# Patient Record
Sex: Male | Born: 1995
Health system: Southern US, Community
[De-identification: ages and names within clinical notes are randomized; demographics above are authoritative.]

## PROBLEM LIST (undated history)

## (undated) DIAGNOSIS — U071 COVID-19: Secondary | ICD-10-CM

---
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
## 2016-10-09 ENCOUNTER — Ambulatory Visit (INDEPENDENT_AMBULATORY_CARE_PROVIDER_SITE_OTHER): Payer: BLUE CROSS/BLUE SHIELD | Admitting: Family Medicine

## 2016-10-09 ENCOUNTER — Encounter: Payer: Self-pay | Admitting: Family Medicine

## 2016-10-09 VITALS — BP 122/72 | HR 85 | Temp 97.8°F | Resp 17 | Ht 72.5 in | Wt 160.0 lb

## 2016-10-09 DIAGNOSIS — J0100 Acute maxillary sinusitis, unspecified: Secondary | ICD-10-CM

## 2016-10-09 MED ORDER — AMOXICILLIN-POT CLAVULANATE 875-125 MG PO TABS: 1.0000 | ORAL_TABLET | Freq: Two times a day (BID) | ORAL | 0 refills | Status: DC

## 2016-10-09 MED ORDER — FLUTICASONE PROPIONATE 50 MCG/ACT NA SUSP: 2.0000 | Freq: Every day | NASAL | 6 refills | Status: DC

## 2016-10-09 NOTE — Progress Notes (Signed)
  Chief Complaint  Patient presents with  . Sinusitis  . URI  . Cough    HPI  Pt reports that his symptoms include Sore throat Sinus pressure  Productive cough with green phlegm Body aches Fatigue His little brother, 16, and sister, 24, have symptoms as well  He denies a history of asthma  No past medical history on file.  Current Outpatient Prescriptions  Medication Sig Dispense Refill  . amoxicillin-clavulanate (AUGMENTIN) 875-125 MG tablet Take 1 tablet by mouth 2 (two) times daily. 20 tablet 0  . fluticasone (FLONASE) 50 MCG/ACT nasal spray Place 2 sprays into both nostrils daily. 16 g 6   No current facility-administered medications for this visit.     Allergies: No Known Allergies  No past surgical history on file.  Social History   Social History  . Marital status: Single    Spouse name: n/a  . Number of children: N/A  . Years of education: N/A   Occupational History  . ice cream scooper    Social History Main Topics  . Smoking status: Never Smoker  . Smokeless tobacco: Never Used  . Alcohol use No  . Drug use: Yes    Types: Marijuana  . Sexual activity: Yes   Other Topics Concern  . None   Social History Narrative  . None    ROS  Objective: Vitals:   10/09/16 1347  BP: 122/72  Pulse: 85  Resp: 17  Temp: 97.8 F (36.6 C)  TempSrc: Oral  SpO2: 97%  Weight: 160 lb (72.6 kg)  Height: 6' 0.5" (1.842 m)    Physical Exam General: alert, oriented, in NAD Head: normocephalic, atraumatic, + maxillary sinus tenderness Eyes: EOM intact, no scleral icterus or conjunctival injection Ears: TM clear bilaterally Nose: edema of the nares bilaterally Throat: no pharyngeal exudate or erythema Lymph: no posterior auricular, submental or cervical lymph adenopathy Heart: normal rate, normal sinus rhythm, no murmurs Lungs: clear to auscultation bilaterally, no wheezing   Assessment and Plan Jack Yates was seen today for sinusitis, uri and  cough.  Diagnoses and all orders for this visit:  Acute non-recurrent maxillary sinusitis  Advised OTC cough meds Discussed medicines Work note -     fluticasone (FLONASE) 50 MCG/ACT nasal spray; Place 2 sprays into both nostrils daily. -     amoxicillin-clavulanate (AUGMENTIN) 875-125 MG tablet; Take 1 tablet by mouth 2 (two) times daily.     Zoe A Stallings

## 2016-10-09 NOTE — Patient Instructions (Addendum)
   IF you received an x-ray today, you will receive an invoice from [facility]. Please contact [facility] at [phone] with questions or concerns regarding your invoice.   IF you received labwork today, you will receive an invoice from [facility]. Please contact [facility] at [phone] with questions or concerns regarding your invoice.   Our billing staff will not be able to assist you with questions regarding bills from these companies.  You will be contacted with the lab results as soon as they are available. The fastest way to get your results is to activate your My Chart account. Instructions are located on the last page of this paperwork. If you have not heard from us regarding the results in 2 weeks, please contact this office.      Sinusitis, Adult Sinusitis is soreness and inflammation of your sinuses. Sinuses are hollow spaces in the bones around your face. Your sinuses are located:  Around your eyes.  In the middle of your forehead.  Behind your nose.  In your cheekbones. Your sinuses and nasal passages are lined with a stringy fluid (mucus). Mucus normally drains out of your sinuses. When your nasal tissues become inflamed or swollen, the mucus can become trapped or blocked so air cannot flow through your sinuses. This allows bacteria, viruses, and funguses to grow, which leads to infection. Sinusitis can develop quickly and last for 7?10 days (acute) or for more than 12 weeks (chronic). Sinusitis often develops after a cold. What are the causes? This condition is caused by anything that creates swelling in the sinuses or stops mucus from draining, including:  Allergies.  Asthma.  Bacterial or viral infection.  Abnormally shaped bones between the nasal passages.  Nasal growths that contain mucus (nasal polyps).  Narrow sinus openings.  Pollutants, such as chemicals or irritants in the air.  A foreign object stuck in the nose.  A fungal  infection. This is rare. What increases the risk? The following factors may make you more likely to develop this condition:  Having allergies or asthma.  Having had a recent cold or respiratory tract infection.  Having structural deformities or blockages in your nose or sinuses.  Having a weak immune system.  Doing a lot of swimming or diving.  Overusing nasal sprays.  Smoking. What are the signs or symptoms? The main symptoms of this condition are pain and a feeling of pressure around the affected sinuses. Other symptoms include:  Upper toothache.  Earache.  Headache.  Bad breath.  Decreased sense of smell and taste.  A cough that may get worse at night.  Fatigue.  Fever.  Thick drainage from your nose. The drainage is often green and it may contain pus (purulent).  Stuffy nose or congestion.  Postnasal drip. This is when extra mucus collects in the throat or back of the nose.  Swelling and warmth over the affected sinuses.  Sore throat.  Sensitivity to light. How is this diagnosed? This condition is diagnosed based on symptoms, a medical history, and a physical exam. To find out if your condition is acute or chronic, your health care provider may:  Look in your nose for signs of nasal polyps.  Tap over the affected sinus to check for signs of infection.  View the inside of your sinuses using an imaging device that has a light attached (endoscope). If your health care provider suspects that you have chronic sinusitis, you may also:  Be tested for allergies.  Have a sample of   mucus taken from your nose (nasal culture) and checked for bacteria.  Have a mucus sample examined to see if your sinusitis is related to an allergy. If your sinusitis does not respond to treatment and it lasts longer than 8 weeks, you may have an MRI or CT scan to check your sinuses. These scans also help to determine how severe your infection is. In rare cases, a bone biopsy may  be done to rule out more serious types of fungal sinus disease. How is this treated? Treatment for sinusitis depends on the cause and whether your condition is chronic or acute. If a virus is causing your sinusitis, your symptoms will go away on their own within 10 days. You may be given medicines to relieve your symptoms, including:  Topical nasal decongestants. They shrink swollen nasal passages and let mucus drain from your sinuses.  Antihistamines. These drugs block inflammation that is triggered by allergies. This can help to ease swelling in your nose and sinuses.  Topical nasal corticosteroids. These are nasal sprays that ease inflammation and swelling in your nose and sinuses.  Nasal saline washes. These rinses can help to get rid of thick mucus in your nose. If your condition is caused by bacteria, you will be given an antibiotic medicine. If your condition is caused by a fungus, you will be given an antifungal medicine. Surgery may be needed to correct underlying conditions, such as narrow nasal passages. Surgery may also be needed to remove polyps. Follow these instructions at home: Medicines   Take, use, or apply over-the-counter and prescription medicines only as told by your health care provider. These may include nasal sprays.  If you were prescribed an antibiotic medicine, take it as told by your health care provider. Do not stop taking the antibiotic even if you start to feel better. Hydrate and Humidify   Drink enough water to keep your urine clear or pale yellow. Staying hydrated will help to thin your mucus.  Use a cool mist humidifier to keep the humidity level in your home above 50%.  Inhale steam for 10-15 minutes, 3-4 times a day or as told by your health care provider. You can do this in the bathroom while a hot shower is running.  Limit your exposure to cool or dry air. Rest   Rest as much as possible.  Sleep with your head raised (elevated).  Make sure to  get enough sleep each night. General instructions   Apply a warm, moist washcloth to your face 3-4 times a day or as told by your health care provider. This will help with discomfort.  Wash your hands often with soap and water to reduce your exposure to viruses and other germs. If soap and water are not available, use hand sanitizer.  Do not smoke. Avoid being around people who are smoking (secondhand smoke).  Keep all follow-up visits as told by your health care provider. This is important. Contact a health care provider if:  You have a fever.  Your symptoms get worse.  Your symptoms do not improve within 10 days. Get help right away if:  You have a severe headache.  You have persistent vomiting.  You have pain or swelling around your face or eyes.  You have vision problems.  You develop confusion.  Your neck is stiff.  You have trouble breathing. This information is not intended to replace advice given to you by your health care provider. Make sure you discuss any questions you have with   your health care provider. Document Released: 07/20/2005 Document Revised: 03/15/2016 Document Reviewed: 05/15/2015 Elsevier Interactive Patient Education  2017 Elsevier Inc.  

## 2017-09-18 ENCOUNTER — Ambulatory Visit (HOSPITAL_COMMUNITY)
Admission: EM | Admit: 2017-09-18 | Discharge: 2017-09-18 | Disposition: A | Discharge status: Home / Self Care | Payer: BLUE CROSS/BLUE SHIELD | Attending: Family Medicine | Admitting: Family Medicine

## 2017-09-18 ENCOUNTER — Encounter (HOSPITAL_COMMUNITY): Payer: Self-pay | Admitting: Family Medicine

## 2017-09-18 DIAGNOSIS — J40 Bronchitis, not specified as acute or chronic: Secondary | ICD-10-CM

## 2017-09-18 DIAGNOSIS — R6889 Other general symptoms and signs: Secondary | ICD-10-CM

## 2017-09-18 MED ORDER — ACETAMINOPHEN 325 MG PO TABS
ORAL_TABLET | ORAL | Status: AC
  Filled 2017-09-18: qty 2

## 2017-09-18 MED ORDER — AZITHROMYCIN 250 MG PO TABS: 250.0000 mg | ORAL_TABLET | Freq: Every day | ORAL | 0 refills | Status: DC

## 2017-09-18 MED ORDER — ACETAMINOPHEN 325 MG PO TABS
650.0000 mg | ORAL_TABLET | Freq: Once | ORAL | Status: AC
  Administered 2017-09-18: 650 mg via ORAL

## 2017-09-18 MED ORDER — OSELTAMIVIR PHOSPHATE 75 MG PO CAPS: 75.0000 mg | ORAL_CAPSULE | Freq: Two times a day (BID) | ORAL | 0 refills | Status: DC

## 2017-09-18 NOTE — ED Provider Notes (Addendum)
[facility]   [id number] 09/18/17 Arrival Time: 1244  ASSESSMENT & PLAN:  1. Flu-like symptoms   2. Bronchitis     Meds ordered this encounter  Medications  . acetaminophen (TYLENOL) tablet 650 mg  . azithromycin (ZITHROMAX) 250 MG tablet    Sig: Take 1 tablet (250 mg total) by mouth daily. Take first 2 tablets together, then 1 every day until finished.    Dispense:  6 tablet    Refill:  0    Order Specific Question:   Supervising Provider    Answer:   Mardella Layman [id number]  . oseltamivir (TAMIFLU) 75 MG capsule    Sig: Take 1 capsule (75 mg total) by mouth every 12 (twelve) hours.    Dispense:  10 capsule    Refill:  0    Order Specific Question:   Supervising Provider    Answer:   Mardella Layman [[id number]]    Reviewed expectations re: course of current medical issues. Questions answered. Outlined signs and symptoms indicating need for more acute intervention. Patient verbalized understanding. After Visit Summary given.   SUBJECTIVE: History from: patient. Jack Yates is a 22 y.o. male who presents with complaint of persistent cough and flu like sx's. Reports abrupt onset today. Described symptoms have gradually worsened since beginning.  ROS: As per HPI.   OBJECTIVE:  Vitals:   09/18/17 1337  BP: 120/69  Pulse: 98  Resp: (!) 22  Temp: 100.2 F (37.9 C)  SpO2: 98%    General appearance: alert; no distress Eyes: PERRLA; EOMI; conjunctiva normal HENT: normocephalic; atraumatic; TMs normal; nasal mucosa normal; oral mucosa normal Neck: supple  Lungs: clear to auscultation bilaterally Heart: regular rate and rhythm Abdomen: soft, non-tender; bowel sounds normal; no masses or organomegaly; no guarding or rebound tenderness Back: no CVA tenderness Extremities: no cyanosis or edema; symmetrical with no gross deformities Skin: warm and dry Neurologic: normal gait; normal symmetric reflexes Psychological: alert and cooperative; normal mood and  affect  Labs: Results for orders placed or performed during the hospital encounter of 04/03/14  Salicylate level  Result Value Ref Range   Salicylate Lvl <2.0 (L) 2.8 - 20.0 mg/dL  Acetaminophen level  Result Value Ref Range   Acetaminophen (Tylenol), Serum <15.0 10 - 30 ug/mL  Ethanol  Result Value Ref Range   Alcohol, Ethyl (B) <11 0 - 11 mg/dL  Drug screen panel, emergency  Result Value Ref Range   Opiates NONE DETECTED NONE DETECTED   Cocaine NONE DETECTED NONE DETECTED   Benzodiazepines NONE DETECTED NONE DETECTED   Amphetamines NONE DETECTED NONE DETECTED   Tetrahydrocannabinol POSITIVE (A) NONE DETECTED   Barbiturates NONE DETECTED NONE DETECTED  Urinalysis, Routine w reflex microscopic  Result Value Ref Range   Color, Urine AMBER (A) YELLOW   APPearance CLOUDY (A) CLEAR   Specific Gravity, Urine 1.027 1.005 - 1.030   pH 7.5 5.0 - 8.0   Glucose, UA NEGATIVE NEGATIVE mg/dL   Hgb urine dipstick NEGATIVE NEGATIVE   Bilirubin Urine NEGATIVE NEGATIVE   Ketones, ur 15 (A) NEGATIVE mg/dL   Protein, ur 30 (A) NEGATIVE mg/dL   Urobilinogen, UA 1.0 0.0 - 1.0 mg/dL   Nitrite NEGATIVE NEGATIVE   Leukocytes, UA NEGATIVE NEGATIVE  Comprehensive metabolic panel  Result Value Ref Range   Sodium 139 137 - 147 mEq/L   Potassium 3.4 (L) 3.7 - 5.3 mEq/L   Chloride 102 96 - 112 mEq/L   CO2 22 19 - 32 mEq/L  Glucose, Bld 109 (H) 70 - 99 mg/dL   BUN 13 6 - 23 mg/dL   Creatinine, Ser 1.610.75 0.47 - 1.00 mg/dL   Calcium 9.2 8.4 - 09.610.5 mg/dL   Total Protein 7.2 6.0 - 8.3 g/dL   Albumin 4.5 3.5 - 5.2 g/dL   AST 18 0 - 37 U/L   ALT 13 0 - 53 U/L   Alkaline Phosphatase 87 52 - 171 U/L   Total Bilirubin 1.0 0.3 - 1.2 mg/dL   GFR calc non Af Amer NOT CALCULATED >90 mL/min   GFR calc Af Amer NOT CALCULATED >90 mL/min   Anion gap 15 5 - 15  CBC  Result Value Ref Range   WBC 6.4 4.5 - 13.5 K/uL   RBC 4.61 3.80 - 5.70 MIL/uL   Hemoglobin 14.0 12.0 - 16.0 g/dL   HCT 04.540.1 40.936.0 - 81.149.0 %    MCV 87.0 78.0 - 98.0 fL   MCH 30.4 25.0 - 34.0 pg   MCHC 34.9 31.0 - 37.0 g/dL   RDW 91.412.3 78.211.4 - 95.615.5 %   Platelets 205 150 - 400 K/uL  Urine microscopic-add on  Result Value Ref Range   Squamous Epithelial / LPF RARE RARE   WBC, UA 0-2 <3 WBC/hpf   RBC / HPF 0-2 <3 RBC/hpf   Bacteria, UA FEW (A) RARE   Urine-Other MUCOUS PRESENT    Labs Reviewed - No data to display  Imaging: No results found.  No Known Allergies  History reviewed. No pertinent past medical history. Social History   Socioeconomic History  . Marital status: Single    Spouse name: n/a  . Number of children: Not on file  . Years of education: Not on file  . Highest education level: Not on file  Social Needs  . Financial resource strain: Not on file  . Food insecurity - worry: Not on file  . Food insecurity - inability: Not on file  . Transportation needs - medical: Not on file  . Transportation needs - non-medical: Not on file  Occupational History  . Occupation: ice cream scooper  Tobacco Use  . Smoking status: Never Smoker  . Smokeless tobacco: Never Used  Substance and Sexual Activity  . Alcohol use: No  . Drug use: Yes    Types: Marijuana  . Sexual activity: Yes  Other Topics Concern  . Not on file  Social History Narrative  . Not on file   History reviewed. No pertinent family history. History reviewed. No pertinent surgical history.   Deatra CanterOxford, William J, FNP 09/18/17 2058    Deatra Canterxford, William J, FNP 09/18/17 2115

## 2017-09-18 NOTE — ED Triage Notes (Signed)
Pt here for cough, congestion, SOB and body aches x a few week.

## 2018-04-03 ENCOUNTER — Encounter (HOSPITAL_COMMUNITY): Payer: Self-pay

## 2018-04-03 ENCOUNTER — Other Ambulatory Visit: Payer: Self-pay

## 2018-04-03 ENCOUNTER — Ambulatory Visit (HOSPITAL_COMMUNITY)
Admission: EM | Admit: 2018-04-03 | Discharge: 2018-04-03 | Disposition: A | Discharge status: Home / Self Care | Payer: BLUE CROSS/BLUE SHIELD | Attending: Family Medicine | Admitting: Family Medicine

## 2018-04-03 DIAGNOSIS — H9203 Otalgia, bilateral: Secondary | ICD-10-CM | POA: Insufficient documentation

## 2018-04-03 DIAGNOSIS — J0140 Acute pansinusitis, unspecified: Secondary | ICD-10-CM | POA: Diagnosis not present

## 2018-04-03 DIAGNOSIS — F411 Generalized anxiety disorder: Secondary | ICD-10-CM | POA: Diagnosis not present

## 2018-04-03 DIAGNOSIS — R0981 Nasal congestion: Secondary | ICD-10-CM | POA: Diagnosis not present

## 2018-04-03 DIAGNOSIS — J029 Acute pharyngitis, unspecified: Secondary | ICD-10-CM

## 2018-04-03 DIAGNOSIS — R05 Cough: Secondary | ICD-10-CM

## 2018-04-03 DIAGNOSIS — F1219 Cannabis abuse with unspecified cannabis-induced disorder: Secondary | ICD-10-CM | POA: Diagnosis not present

## 2018-04-03 DIAGNOSIS — F329 Major depressive disorder, single episode, unspecified: Secondary | ICD-10-CM | POA: Diagnosis not present

## 2018-04-03 LAB — POCT RAPID STREP A: Streptococcus, Group A Screen (Direct): NEGATIVE

## 2018-04-03 MED ORDER — AMOXICILLIN-POT CLAVULANATE 875-125 MG PO TABS: 1.0000 | ORAL_TABLET | Freq: Two times a day (BID) | ORAL | 0 refills | Status: DC

## 2018-04-03 MED ORDER — IPRATROPIUM BROMIDE 0.06 % NA SOLN: 2.0000 | Freq: Four times a day (QID) | NASAL | 0 refills | Status: DC

## 2018-04-03 MED ORDER — BENZONATATE 100 MG PO CAPS: 100.0000 mg | ORAL_CAPSULE | Freq: Three times a day (TID) | ORAL | 0 refills | Status: DC

## 2018-04-03 MED ORDER — FLUTICASONE PROPIONATE 50 MCG/ACT NA SUSP: 2.0000 | Freq: Every day | NASAL | 0 refills | Status: DC

## 2018-04-03 NOTE — ED Provider Notes (Addendum)
[facility]    CSN: [id number] Arrival date & time: 04/03/18  1009     History   Chief Complaint Chief Complaint  Patient presents with  . Sore Throat  . Nasal Congestion    HPI [name] is a 22 y.o. male.   22 year old male comes in for 3-week history of URI symptoms.  Has been having productive cough, nasal congestion, rhinorrhea, postnasal drip.  Bilateral ear pain when coughing, yawning, swallowing.  Denies fever, chills, night sweats.  Sore throat, with painful swallowing, but denies shortness of breath, trouble breathing, swelling of the throat, tripoding, drooling.  Has not taken anything for the symptoms.  Positive sick contact.  Never smoker.     History reviewed. No pertinent past medical history.  Patient Active Problem List   Diagnosis Date Noted  . Wrist fracture 12/14/2015  . MDD (major depressive disorder), single episode, moderate (HCC) 04/04/2014  . Generalized anxiety disorder 04/04/2014  . Cannabis use disorder, mild, abuse 04/04/2014    History reviewed. No pertinent surgical history.     Home Medications    Prior to Admission medications   Medication Sig Start Date End Date Taking? Authorizing Provider  amoxicillin-clavulanate (AUGMENTIN) 875-125 MG tablet Take 1 tablet by mouth every 12 (twelve) hours. 04/03/18   Cathie Hoops, Reymond Maynez V, PA-C  benzonatate (TESSALON) 100 MG capsule Take 1 capsule (100 mg total) by mouth every 8 (eight) hours. 04/03/18   Cathie Hoops, Dev Dhondt V, PA-C  fluticasone (FLONASE) 50 MCG/ACT nasal spray Place 2 sprays into both nostrils daily. 04/03/18   Cathie Hoops, Emmett Arntz V, PA-C  ipratropium (ATROVENT) 0.06 % nasal spray Place 2 sprays into both nostrils 4 (four) times daily. 04/03/18   Belinda Fisher, PA-C    Family History History reviewed. No pertinent family history.  Social History Social History   Tobacco Use  . Smoking status: Never Smoker  . Smokeless tobacco: Never Used  Substance Use Topics  . Alcohol use: No  . Drug use: Yes   Types: Marijuana     Allergies   Patient has no known allergies.   Review of Systems Review of Systems  Reason unable to perform ROS: See HPI as above.     Physical Exam Triage Vital Signs ED Triage Vitals  Enc Vitals Group     BP 04/03/18 1031 123/72     Pulse Rate 04/03/18 1031 66     Resp 04/03/18 1031 16     Temp 04/03/18 1031 98 F (36.7 C)     Temp Source 04/03/18 1031 Oral     SpO2 04/03/18 1031 99 %     Weight --      Height --      Head Circumference --      Peak Flow --      Pain Score 04/03/18 1033 8     Pain Loc --      Pain Edu? --      Excl. in GC? --    No data found.  Updated Vital Signs BP 123/72 (BP Location: Left Arm)   Pulse 66   Temp 98 F (36.7 C) (Oral)   Resp 16   SpO2 99%   Physical Exam  Constitutional: He is oriented to person, place, and time. He appears well-developed and well-nourished.  Non-toxic appearance. He does not appear ill. No distress.  HENT:  Head: Normocephalic and atraumatic.  Right Ear: External ear and ear canal normal. Tympanic membrane is erythematous. Tympanic membrane is not bulging.  Left Ear: External ear and ear canal normal. Tympanic membrane is erythematous. Tympanic membrane is not bulging.  Nose: Mucosal edema and rhinorrhea present. Right sinus exhibits maxillary sinus tenderness. Right sinus exhibits no frontal sinus tenderness. Left sinus exhibits maxillary sinus tenderness. Left sinus exhibits no frontal sinus tenderness.  Mouth/Throat: Uvula is midline and mucous membranes are normal. Posterior oropharyngeal erythema present. Tonsils are 2+ on the right. Tonsils are 2+ on the left. No tonsillar exudate.  Eyes: Pupils are equal, round, and reactive to light. Conjunctivae are normal.  Neck: Normal range of motion. Neck supple.  Cardiovascular: Normal rate, regular rhythm and normal heart sounds. Exam reveals no gallop and no friction rub.  No murmur heard. Pulmonary/Chest: Effort normal and breath  sounds normal. He has no decreased breath sounds. He has no wheezes. He has no rhonchi. He has no rales.  Lymphadenopathy:    He has no cervical adenopathy.  Neurological: He is alert and oriented to person, place, and time.  Skin: Skin is warm and dry.  Psychiatric: He has a normal mood and affect. His behavior is normal. Judgment normal.     UC Treatments / Results  Labs (all labs ordered are listed, but only abnormal results are displayed) Labs Reviewed  CULTURE, GROUP A STREP [facility])  POCT RAPID STREP A    EKG None  Radiology No results found.  Procedures Procedures (including critical care time)  Medications Ordered in UC Medications - No data to display  Initial Impression / Assessment and Plan / UC Course  I have reviewed the triage vital signs and the nursing notes.  Pertinent labs & imaging results that were available during my care of the patient were reviewed by me and considered in my medical decision making (see chart for details).    Augmentin for sinusitis.  Other symptomatic treatment discussed.  Return precautions given.  Patient expresses understanding and agrees to plan.  Final Clinical Impressions(s) / UC Diagnoses   Final diagnoses:  Acute non-recurrent pansinusitis    ED Prescriptions    Medication Sig Dispense Auth. Provider   amoxicillin-clavulanate (AUGMENTIN) 875-125 MG tablet Take 1 tablet by mouth every 12 (twelve) hours. 14 tablet Cristi Gwynn V, PA-C   fluticasone (FLONASE) 50 MCG/ACT nasal spray Place 2 sprays into both nostrils daily. 1 g Dorlis Judice V, PA-C   ipratropium (ATROVENT) 0.06 % nasal spray Place 2 sprays into both nostrils 4 (four) times daily. 15 mL Zyaira Vejar V, PA-C   benzonatate (TESSALON) 100 MG capsule Take 1 capsule (100 mg total) by mouth every 8 (eight) hours. 21 capsule Threasa Alpha, PA-C 04/03/18 1055    [address] PA-C 04/03/18 1055

## 2018-04-03 NOTE — ED Notes (Signed)
Pt discharged by provider.

## 2018-04-03 NOTE — ED Triage Notes (Signed)
Pt presents to [facility] for sore throat and nasal congestion x1 month, pt complains of cough, and bilateral ear pain also, pt has been exposed to a sick family member with same symptoms.

## 2018-04-03 NOTE — Discharge Instructions (Signed)
Start augmentin as directed for sinus infection. Tessalon for cough. Start flonase, atrovent nasal spray for nasal congestion/drainage. You can use over the counter nasal saline rinse such as neti pot for nasal congestion. Keep hydrated, your urine should be clear to pale yellow in color. Tylenol/motrin for fever and pain. Monitor for any worsening of symptoms, chest pain, shortness of breath, wheezing, swelling of the throat, follow up for reevaluation.   For sore throat/cough try using a honey-based tea. Use 3 teaspoons of honey with juice squeezed from half lemon. Place shaved pieces of ginger into 1/2-1 cup of water and warm over stove top. Then mix the ingredients and repeat every 4 hours as needed.

## 2018-04-05 LAB — CULTURE, GROUP A STREP (THRC)

## 2018-04-10 IMAGING — CR DG WRIST COMPLETE 3+V*R*
2 series · 2 of 2 positions shown · non-contrast
Comparison: 11/22/2015 and 12/06/2015

CLINICAL DATA: Fall 3 weeks ago, re-evaluate fracture.

EXAM:
RIGHT WRIST - COMPLETE 3+ VIEW

[PA]
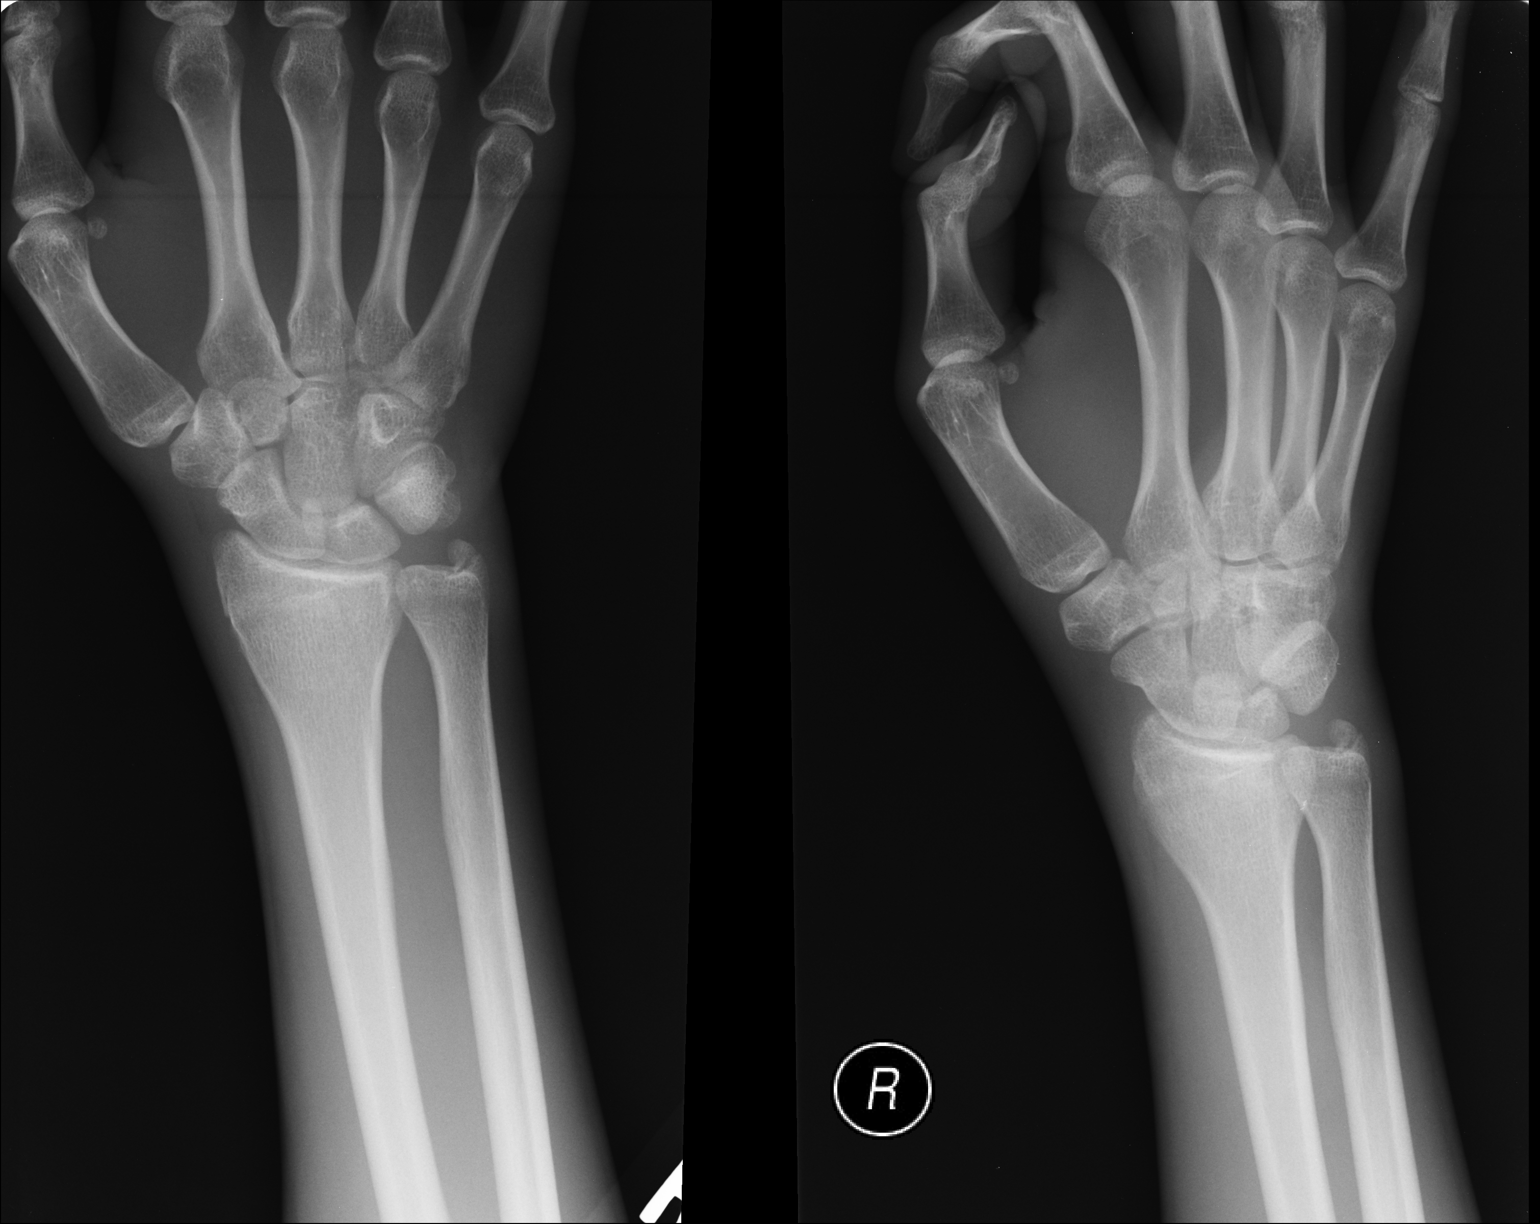

[lateral]
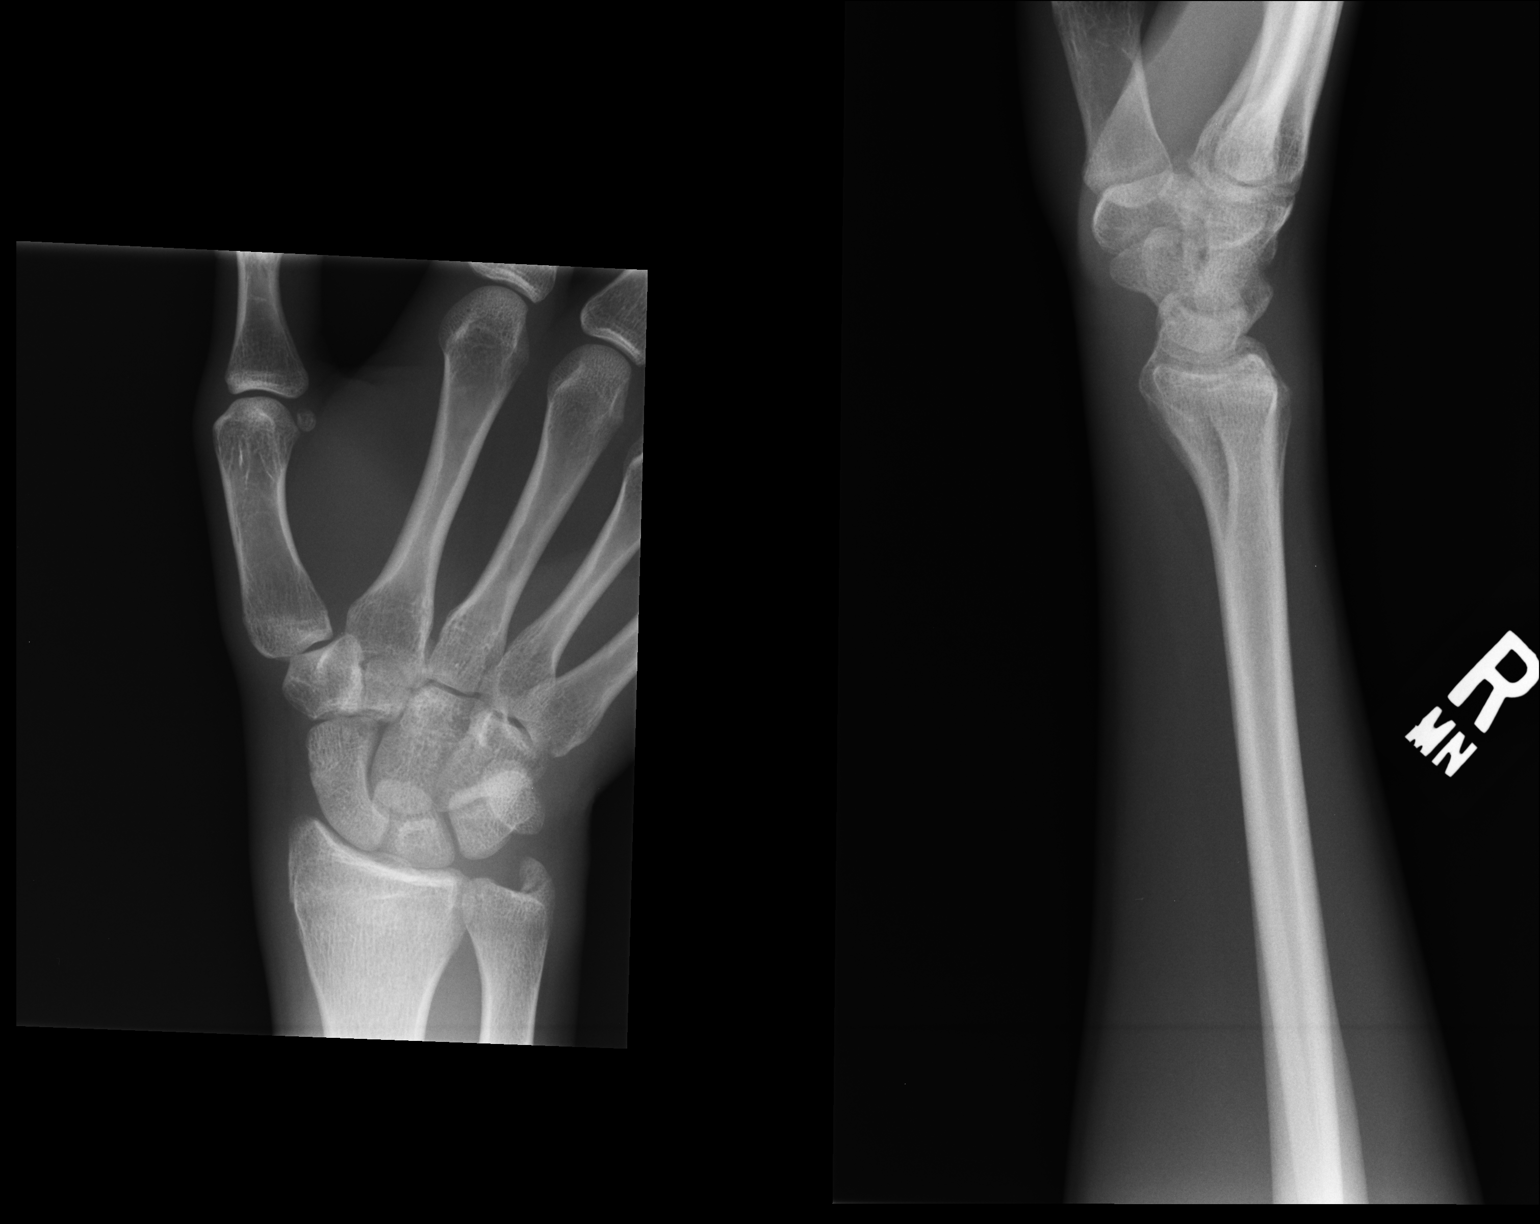

[2 of 2 positions shown; findings below may reference images not displayed]

FINDINGS: No significant change in patient's minimally displaced ulnar styloid
fracture. Lucency involving patient's radial styloid fracture less
well-defined and more difficult to visualize. Remainder of the exam
is unchanged.
IMPRESSION: Healing radial styloid fracture and no change in minimally displaced
ulnar styloid fracture.

## 2018-08-25 ENCOUNTER — Ambulatory Visit: Payer: BLUE CROSS/BLUE SHIELD

## 2018-08-25 ENCOUNTER — Ambulatory Visit (INDEPENDENT_AMBULATORY_CARE_PROVIDER_SITE_OTHER): Payer: BLUE CROSS/BLUE SHIELD

## 2018-08-25 ENCOUNTER — Encounter: Payer: Self-pay | Admitting: Podiatry

## 2018-08-25 ENCOUNTER — Ambulatory Visit: Payer: BLUE CROSS/BLUE SHIELD | Admitting: Podiatry

## 2018-08-25 VITALS — BP 116/68 | HR 65

## 2018-08-25 DIAGNOSIS — M2142 Flat foot [pes planus] (acquired), left foot: Secondary | ICD-10-CM

## 2018-08-25 DIAGNOSIS — M7751 Other enthesopathy of right foot: Secondary | ICD-10-CM | POA: Diagnosis not present

## 2018-08-25 DIAGNOSIS — M2141 Flat foot [pes planus] (acquired), right foot: Secondary | ICD-10-CM

## 2018-08-25 DIAGNOSIS — M79672 Pain in left foot: Secondary | ICD-10-CM

## 2018-08-25 DIAGNOSIS — S93401S Sprain of unspecified ligament of right ankle, sequela: Secondary | ICD-10-CM

## 2018-08-25 MED ORDER — MELOXICAM 15 MG PO TABS: 15.0000 mg | ORAL_TABLET | Freq: Every day | ORAL | 0 refills | Status: DC

## 2018-08-25 NOTE — Patient Instructions (Addendum)
Ankle Sprain, Phase I Rehab Ask your health care provider which exercises are safe for you. Do exercises exactly as told by your health care provider and adjust them as directed. It is normal to feel mild stretching, pulling, tightness, or discomfort as you do these exercises, but you should stop right away if you feel sudden pain or your pain gets worse.Do not begin these exercises until told by your health care provider. Stretching and range of motion exercises These exercises warm up your muscles and joints and improve the movement and flexibility of your lower leg and ankle. These exercises also help to relieve pain and stiffness. Exercise A: Gastroc and soleus stretch  1. Sit on the floor with your left / right leg extended. 2. Loop a belt or towel around the ball of your left / right foot. The ball of your foot is on the walking surface, right under your toes. 3. Keep your left / right ankle and foot relaxed and keep your knee straight while you use the belt or towel to pull your foot toward you. You should feel a gentle stretch behind your calf or knee. 4. Hold this position for __________ seconds, then release to the starting position. Repeat the exercise with your knee bent. You can put a pillow or a rolled bath towel under your knee to support it. You should feel a stretch deep in your calf or at your Achilles tendon. Repeat each stretch __________ times. Complete these stretches __________ times a day. Exercise B: Ankle alphabet  1. Sit with your left / right leg supported at the lower leg. ? Do not rest your foot on anything. ? Make sure your foot has room to move freely. 2. Think of your left / right foot as a paintbrush, and move your foot to trace each letter of the alphabet in the air. Keep your hip and knee still while you trace. Make the letters as large as you can without feeling discomfort. 3. Trace every letter from A to Z. Repeat __________ times. Complete this exercise  __________ times a day. Strengthening exercises These exercises build strength and endurance in your ankle and lower leg. Endurance is the ability to use your muscles for a long time, even after they get tired. Exercise C: Dorsiflexors  1. Secure a rubber exercise band or tube to an object, such as a table leg, that will stay still when the band is pulled. Secure the other end around your left / right foot. 2. Sit on the floor facing the object, with your left / right leg extended. The band or tube should be slightly tense when your foot is relaxed. 3. Slowly bring your foot toward you, pulling the band tighter. 4. Hold this position for __________ seconds. 5. Slowly return your foot to the starting position. Repeat __________ times. Complete this exercise __________ times a day. Exercise D: Plantar flexors  1. Sit on the floor with your left / right leg extended. 2. Loop a rubber exercise tube or band around the ball of your left / right foot. The ball of your foot is on the walking surface, right under your toes. ? Hold the ends of the band or tube in your hands. ? The band or tube should be slightly tense when your foot is relaxed. 3. Slowly point your foot and toes downward, pushing them away from you. 4. Hold this position for __________ seconds. 5. Slowly return your foot to the starting position. Repeat __________ times. Complete   this exercise __________ times a day. Exercise E: Evertors 1. Sit on the floor with your legs straight out in front of you. 2. Loop a rubber exercise band or tube around the ball of your left / right foot. The ball of your foot is on the walking surface, right under your toes. ? Hold the ends of the band in your hands, or secure the band to a stable object. ? The band or tube should be slightly tense when your foot is relaxed. 3. Slowly push your foot outward, away from your other leg. 4. Hold this position for __________ seconds. 5. Slowly return your  foot to the starting position. Repeat __________ times. Complete this exercise __________ times a day. This information is not intended to replace advice given to you by your health care provider. Make sure you discuss any questions you have with your health care provider. Document Released: 02/18/2005 Document Revised: 01/04/2017 Document Reviewed: 06/03/2015 Elsevier Interactive Patient Education  2019 [employer].  For instructions on how to put on your Tri-Lock Ankle Brace, please visit [email or web address]

## 2018-08-26 NOTE — Progress Notes (Signed)
Subjective:   Patient ID: Jack Yates, male   DOB: 23 y.o.   MRN: [id number]   HPI 23 year old male presents the office today for concerns of ankle pain on his right ankle.  He states that he has sprained ankles previously and resolved in the next couple of days however recently he sprained his ankle last 2 to 3 months and since then he has had some intermittent discomfort to the right ankle and swelling.  He states he plays basketball and this is when the injury occurred.  He had no treatment after the injury.  He states that he stands at work and will notice some swelling and discomfort to his foot and after being on his foot for some time it starts to bother him. Also he has not been playing basketball since the injury.  He has no discomfort at rest and only with activity.  He has no other concerns today.  No recent treatment.   Review of Systems  All other systems reviewed and are negative.  History reviewed. No pertinent past medical history.  History reviewed. No pertinent surgical history.   Current Outpatient Medications:  .  amoxicillin-clavulanate (AUGMENTIN) 875-125 MG tablet, Take 1 tablet by mouth every 12 (twelve) hours., Disp: 14 tablet, Rfl: 0 .  benzonatate (TESSALON) 100 MG capsule, Take 1 capsule (100 mg total) by mouth every 8 (eight) hours., Disp: 21 capsule, Rfl: 0 .  fluticasone (FLONASE) 50 MCG/ACT nasal spray, Place 2 sprays into both nostrils daily., Disp: 1 g, Rfl: 0 .  ipratropium (ATROVENT) 0.06 % nasal spray, Place 2 sprays into both nostrils 4 (four) times daily., Disp: 15 mL, Rfl: 0 .  meloxicam (MOBIC) 15 MG tablet, Take 1 tablet (15 mg total) by mouth daily., Disp: 30 tablet, Rfl: 0  No Known Allergies      Objective:  Physical Exam  General: AAO x3, NAD  Dermatological: Skin is warm, dry and supple bilateral. Nails x 10 are well manicured; remaining integument appears unremarkable at this time. There are no open sores, no preulcerative lesions, no  rash or signs of infection present.  Vascular: Dorsalis Pedis artery and Posterior Tibial artery pedal pulses are 2/4 bilateral with immedate capillary fill time. Pedal hair growth present. No varicosities and no lower extremity edema present bilateral. There is no pain with calf compression, swelling, warmth, erythema.   Neruologic: Grossly intact via light touch bilateral.Protective threshold with Semmes Wienstein monofilament intact to all pedal sites bilateral.  Negative Tinel sign.  Musculoskeletal: There is some subjective tenderness on the medial aspect and over the course the posterior tibial, flexor tendons as well as the lateral aspect of the ankle and the peroneal tendon however not able to elicit any tenderness today.  Also subjectively there is some discomfort on the ATFL area on the anterior lateral ankle joint however no significant discomfort upon palpation.  He states the majority of pain is with weightbearing and pressure.  Talar tilt, anterior drawer appears to be negative.  He does not have chronic swelling when he walks.  He does not feel that his ankles going to get out.  Muscular strength 5/5 in all groups tested bilateral.  Flatfoot deformities present.  Gait: Unassisted, Nonantalgic.       Assessment:   Right chronic ankle sprain, tendinitis    Plan:  -Treatment options discussed including all alternatives, risks, and complications -Etiology of symptoms were discussed -X-rays were obtained and reviewed with the patient.  There is no evidence of  acute fracture or stress fracture.  On the oblique view of the ankle there is a questionable area of an old fracture but this appears to be healed. -As well we discussed treatment options.  Discussed MRI but they want to hold off on this.  I am in agreement with this as he is having no pain on exam and I would think if there is a tear he would have tenderness upon palpation.  There is no gross ankle instability present.  For now I  dispensed a Tri-Lock ankle brace I want to start some home rehab exercises.  Anti-inflammatories, meloxicam was ordered.  This also going to formal physical therapy.  Also with his flatfoot I think he will benefit from inserts to help support his foot and ankle.  We will check insurance coverage and let them know.  He and his mom are in agreement to this and no further questions.  Return in about 4 weeks (around 09/22/2018).

## 2018-08-29 ENCOUNTER — Encounter: Payer: Self-pay | Admitting: Podiatry

## 2018-09-22 ENCOUNTER — Ambulatory Visit: Payer: BLUE CROSS/BLUE SHIELD | Admitting: Podiatry

## 2018-09-27 ENCOUNTER — Ambulatory Visit: Payer: BLUE CROSS/BLUE SHIELD | Admitting: Podiatry

## 2018-10-11 ENCOUNTER — Encounter: Payer: Self-pay | Admitting: Podiatry

## 2018-10-11 ENCOUNTER — Ambulatory Visit: Payer: BLUE CROSS/BLUE SHIELD | Admitting: Podiatry

## 2018-10-11 DIAGNOSIS — M2142 Flat foot [pes planus] (acquired), left foot: Secondary | ICD-10-CM

## 2018-10-11 DIAGNOSIS — M2141 Flat foot [pes planus] (acquired), right foot: Secondary | ICD-10-CM

## 2018-10-11 DIAGNOSIS — M7751 Other enthesopathy of right foot: Secondary | ICD-10-CM

## 2018-10-11 DIAGNOSIS — S93401S Sprain of unspecified ligament of right ankle, sequela: Secondary | ICD-10-CM

## 2018-10-11 NOTE — Patient Instructions (Addendum)
Look at getting a "superfeet" insert for your shoes  Continue the exercises below and gradually increase how often   Ankle Sprain, Phase I Rehab Ask your health care provider which exercises are safe for you. Do exercises exactly as told by your health care provider and adjust them as directed. It is normal to feel mild stretching, pulling, tightness, or discomfort as you do these exercises, but you should stop right away if you feel sudden pain or your pain gets worse.Do not begin these exercises until told by your health care provider. Stretching and range of motion exercises These exercises warm up your muscles and joints and improve the movement and flexibility of your lower leg and ankle. These exercises also help to relieve pain and stiffness. Exercise A: Gastroc and soleus stretch  1. Sit on the floor with your left / right leg extended. 2. Loop a belt or towel around the ball of your left / right foot. The ball of your foot is on the walking surface, right under your toes. 3. Keep your left / right ankle and foot relaxed and keep your knee straight while you use the belt or towel to pull your foot toward you. You should feel a gentle stretch behind your calf or knee. 4. Hold this position for __________ seconds, then release to the starting position. Repeat the exercise with your knee bent. You can put a pillow or a rolled bath towel under your knee to support it. You should feel a stretch deep in your calf or at your Achilles tendon. Repeat each stretch __________ times. Complete these stretches __________ times a day. Exercise B: Ankle alphabet  1. Sit with your left / right leg supported at the lower leg. ? Do not rest your foot on anything. ? Make sure your foot has room to move freely. 2. Think of your left / right foot as a paintbrush, and move your foot to trace each letter of the alphabet in the air. Keep your hip and knee still while you trace. Make the letters as large as you  can without feeling discomfort. 3. Trace every letter from A to Z. Repeat __________ times. Complete this exercise __________ times a day. Strengthening exercises These exercises build strength and endurance in your ankle and lower leg. Endurance is the ability to use your muscles for a long time, even after they get tired. Exercise C: Dorsiflexors  1. Secure a rubber exercise band or tube to an object, such as a table leg, that will stay still when the band is pulled. Secure the other end around your left / right foot. 2. Sit on the floor facing the object, with your left / right leg extended. The band or tube should be slightly tense when your foot is relaxed. 3. Slowly bring your foot toward you, pulling the band tighter. 4. Hold this position for __________ seconds. 5. Slowly return your foot to the starting position. Repeat __________ times. Complete this exercise __________ times a day. Exercise D: Plantar flexors  1. Sit on the floor with your left / right leg extended. 2. Loop a rubber exercise tube or band around the ball of your left / right foot. The ball of your foot is on the walking surface, right under your toes. ? Hold the ends of the band or tube in your hands. ? The band or tube should be slightly tense when your foot is relaxed. 3. Slowly point your foot and toes downward, pushing them away from  you. 4. Hold this position for __________ seconds. 5. Slowly return your foot to the starting position. Repeat __________ times. Complete this exercise __________ times a day. Exercise E: Evertors 1. Sit on the floor with your legs straight out in front of you. 2. Loop a rubber exercise band or tube around the ball of your left / right foot. The ball of your foot is on the walking surface, right under your toes. ? Hold the ends of the band in your hands, or secure the band to a stable object. ? The band or tube should be slightly tense when your foot is relaxed. 3. Slowly push  your foot outward, away from your other leg. 4. Hold this position for __________ seconds. 5. Slowly return your foot to the starting position. Repeat __________ times. Complete this exercise __________ times a day. This information is not intended to replace advice given to you by your health care provider. Make sure you discuss any questions you have with your health care provider. Document Released: 02/18/2005 Document Revised: 01/04/2017 Document Reviewed: 06/03/2015 Elsevier Interactive Patient Education  2019 [employer].

## 2018-10-20 NOTE — Progress Notes (Signed)
Subjective: 23 year old male presents the office today for follow-up evaluation of right chronic ankle sprain, tendinitis.  He states that overall he is doing better but not 100% quite yet.  He states he feels still weakness to his ankle and has been doing the home stretching, ice exercises daily.  He has been wearing an ankle brace which is been helping quite a bit as well.  Also taken meloxicam which is helpful.  No swelling or redness.  No recent injury.  He has no other concerns today. Denies any systemic complaints such as fevers, chills, nausea, vomiting. No acute changes since last appointment, and no other complaints at this time.   Objective: AAO x3, NAD DP/PT pulses palpable bilaterally, CRT less than 3 seconds There is no significant point bony tenderness or pain to vibratory sensation to the right ankle.  Subjectively describing some pain to the anterior ankle joint line that he points to.  There is no gross ankle instability present.  There is a significant flatfoot deformity present.  No pain on the lateral ankle ligaments, deltoid ligaments.  No pain to the foot or proximal tib-fib. No open lesions or pre-ulcerative lesions.  No pain with calf compression, swelling, warmth, erythema  Assessment: Right chronic ankle pain, ankle sprain; flatfoot  Plan: -All treatment options discussed with the patient including all alternatives, risks, complications.  -This time he is doing better.  I want him to continue with home exercises.  Discussed physical therapy but he wants to hold off on this.  Ultimately he can control his foot type as well.  We discussed inserts.  Due to cost he is can start with over-the-counter insert.  We discussed a Superfeet type insert.  -Meloxicam as needed -Patient encouraged to call the office with any questions, concerns, change in symptoms.   Return in about 6 weeks (around 11/22/2018).  Vivi Barrack DPM

## 2018-11-20 DIAGNOSIS — J209 Acute bronchitis, unspecified: Secondary | ICD-10-CM | POA: Diagnosis not present

## 2018-11-22 ENCOUNTER — Ambulatory Visit: Payer: BLUE CROSS/BLUE SHIELD | Admitting: Podiatry

## 2019-04-18 ENCOUNTER — Encounter: Payer: Self-pay | Admitting: Emergency Medicine

## 2019-04-18 ENCOUNTER — Ambulatory Visit
Admission: EM | Admit: 2019-04-18 | Discharge: 2019-04-18 | Disposition: A | Discharge status: Home / Self Care | Payer: BC Managed Care – PPO | Attending: Emergency Medicine | Admitting: Emergency Medicine

## 2019-04-18 ENCOUNTER — Other Ambulatory Visit: Payer: Self-pay

## 2019-04-18 DIAGNOSIS — K529 Noninfective gastroenteritis and colitis, unspecified: Secondary | ICD-10-CM | POA: Diagnosis not present

## 2019-04-18 MED ORDER — LORATADINE 10 MG PO TABS: 10.0000 mg | ORAL_TABLET | Freq: Every day | ORAL | 0 refills | Status: DC

## 2019-04-18 MED ORDER — FLUTICASONE PROPIONATE 50 MCG/ACT NA SUSP: 2.0000 | Freq: Every day | NASAL | 0 refills | Status: DC

## 2019-04-18 NOTE — ED Triage Notes (Signed)
Pt presents for generalized abdominal pain with some stabbing pains, especially right before a BM.  Patient c/o diarrhea x 1 week, with a few episodes of vomiting.  Pt c/o chills, and headache

## 2019-04-18 NOTE — Discharge Instructions (Addendum)
Important to stay hydrated: 1 gallon a day of water, may supplement with Gatorade/Pedialyte. Return for further evaluation if you develop fever, worsening abdominal pain, blood in your stool, rectal pain. Go to ER for further evaluation if your abdominal pain worsens, or radiates to your right lower abdomen, you develop fever.

## 2019-04-18 NOTE — ED Provider Notes (Signed)
[name]    CSN: [id number] Arrival date & time: 04/18/19  0258      History   Chief Complaint Chief Complaint  Patient presents with   Abdominal Cramping    HPI [place] JOENATHAN SAKUMA is a 23 y.o. male with history of GAD, cannabis use presenting for 7-day course of generalized abdominal pain, soften stools without blood or melena.  Patient also endorsing few episodes of non-biliary, nonbloody emesis 3 to 4 days ago, has not had any since.  Patient able to tolerate oral hydration, food intake without worsening of pain, emesis.  Patient denies malodor, change in color, profuse/watery diarrhea.  States he is having about 5 bowel movements daily.  No previous C. difficile infection.  Patient has not had any abdominal surgeries. Patient states his allergies are also acting up: Endorsing nasal congestion, frontal headache that is resolved with Tylenol.  Patient has not been compliant with his Flonase, loratadine.  Of note, patient was treated on 9/1 in urgent care for acute nonrecurrent pansinusitis with Augmentin.  Patient took all the medications and reported symptom resolution thereafter.   History reviewed. No pertinent past medical history.  Patient Active Problem List   Diagnosis Date Noted   Wrist fracture 12/14/2015   MDD (major depressive disorder), single episode, moderate ([place]) 04/04/2014   Generalized anxiety disorder 04/04/2014   Cannabis use disorder, mild, abuse 04/04/2014    History reviewed. No pertinent surgical history.     Home Medications    Prior to Admission medications   Medication Sig Start Date End Date Taking? Authorizing Provider  fluticasone (FLONASE) 50 MCG/ACT nasal spray Place 2 sprays into both nostrils daily. 04/18/19   Hall-Potvin, [place], PA-C  ipratropium (ATROVENT) 0.06 % nasal spray Place 2 sprays into both nostrils 4 (four) times daily. 04/03/18   Tasia Catchings, Amy V, PA-C  loratadine (CLARITIN) 10 MG tablet Take 1 tablet (10 mg total)  by mouth daily. 04/18/19   Hall-Potvin, [place], PA-C    Family History History reviewed. No pertinent family history.  Social History Social History   Tobacco Use   Smoking status: Never Smoker   Smokeless tobacco: Never Used  Substance Use Topics   Alcohol use: No   Drug use: Yes    Types: Marijuana     Allergies   Patient has no known allergies.   Review of Systems Review of Systems  Constitutional: Negative for fatigue and fever.  HENT: Positive for congestion, postnasal drip, rhinorrhea and sneezing. Negative for sinus pressure, sinus pain, sore throat, tinnitus, trouble swallowing and voice change.   Eyes: Negative for photophobia, pain, discharge, redness, itching and visual disturbance.  Respiratory: Positive for cough. Negative for shortness of breath.   Cardiovascular: Negative for chest pain and palpitations.  Gastrointestinal: Positive for abdominal pain and diarrhea. Negative for abdominal distention, anal bleeding, blood in stool, constipation, nausea, rectal pain and vomiting.  Genitourinary: Negative for dysuria and frequency.  Musculoskeletal: Negative for arthralgias and myalgias.  Skin: Negative for rash and wound.  Neurological: Negative for dizziness, speech difficulty, weakness, light-headedness and headaches.  All other systems reviewed and are negative.    Physical Exam Triage Vital Signs ED Triage Vitals  Enc Vitals Group     BP      Pulse      Resp      Temp      Temp src      SpO2      Weight      Height  Head Circumference      Peak Flow      Pain Score      Pain Loc      Pain Edu?      Excl. in GC?    No data found.  Updated Vital Signs BP 116/73 (BP Location: Left Arm)    Pulse 77    Temp 97.7 F (36.5 C) (Oral)    Resp 18    SpO2 98%   Visual Acuity Right Eye Distance:   Left Eye Distance:   Bilateral Distance:    Right Eye Near:   Left Eye Near:    Bilateral Near:     Physical Exam Constitutional:       General: He is not in acute distress. HENT:     Head: Normocephalic and atraumatic.     Right Ear: Tympanic membrane, ear canal and external ear normal.     Left Ear: Tympanic membrane, ear canal and external ear normal.     Nose: No nasal deformity, congestion or rhinorrhea.     Comments: Bilateral turbinate edema with mucosal pallor    Mouth/Throat:     Mouth: Mucous membranes are moist.     Tongue: Tongue does not deviate from midline.     Pharynx: Oropharynx is clear. Uvula midline.     Comments: No tonsillar hypertrophy or exudate Eyes:     General: No scleral icterus.    Conjunctiva/sclera: Conjunctivae normal.     Pupils: Pupils are equal, round, and reactive to light.  Neck:     Musculoskeletal: Normal range of motion and neck supple. No muscular tenderness.  Cardiovascular:     Rate and Rhythm: Normal rate and regular rhythm.     Heart sounds: No murmur. No gallop.   Pulmonary:     Effort: Pulmonary effort is normal. No respiratory distress.     Breath sounds: No stridor. No wheezing, rhonchi or rales.  Abdominal:     General: Abdomen is flat. Bowel sounds are normal. There is no distension.     Palpations: Abdomen is soft.     Tenderness: There is no right CVA tenderness, left CVA tenderness or rebound.     Comments: Diffuse lower abdominal tenderness.  Musculoskeletal: Normal range of motion.     Right lower leg: No edema.     Left lower leg: No edema.  Lymphadenopathy:     Cervical: No cervical adenopathy.  Skin:    General: Skin is warm.     Capillary Refill: Capillary refill takes less than 2 seconds.     Coloration: Skin is not jaundiced or pale.     Findings: No bruising.  Neurological:     General: No focal deficit present.     Mental Status: He is alert and oriented to person, place, and time.      UC Treatments / Results  Labs (all labs ordered are listed, but only abnormal results are displayed) Labs Reviewed - No data to  display  EKG   Radiology No results found.  Procedures Procedures (including critical care time)  Medications Ordered in UC Medications - No data to display  Initial Impression / Assessment and Plan / UC Course  I have reviewed the triage vital signs and the nursing notes.  Pertinent labs & imaging results that were available during my care of the patient were reviewed by me and considered in my medical decision making (see chart for details).     1.  Gastroenteritis Patient appears well-hydrated,  nontoxic and afebrile in office without antipyretic onboard.  No alarm symptoms on exam that would lead me to be concern for acute appendicitis at this time.  Discussed return precautions thereof which patient verbalized understanding.  Likely viral in etiology, though given upper respiratory symptoms as well as patient working in public setting, COVID testing was obtained.  Will treat seasonal allergies with his home medications.  Return precautions discussed, patient verbalized understanding and is agreeable to plan. Final Clinical Impressions(s) / UC Diagnoses   Final diagnoses:  Gastroenteritis     Discharge Instructions     Important to stay hydrated: 1 gallon a day of water, may supplement with Gatorade/Pedialyte. Return for further evaluation if you develop fever, worsening abdominal pain, blood in your stool, rectal pain. Go to ER for further evaluation if your abdominal pain worsens, or radiates to your right lower abdomen, you develop fever.    ED Prescriptions    Medication Sig Dispense Auth. Provider   fluticasone (FLONASE) 50 MCG/ACT nasal spray Place 2 sprays into both nostrils daily. 16 g Hall-Potvin, [place], PA-C   loratadine (CLARITIN) 10 MG tablet Take 1 tablet (10 mg total) by mouth daily. 30 tablet Hall-Potvin, [place], PA-C     Controlled Substance Prescriptions [place] Controlled Substance Registry consulted? Not Applicable   Shea EvansHall-Potvin, Saron Vanorman,  [place] 04/18/19 [phone]

## 2019-04-18 NOTE — ED Notes (Signed)
Patient able to ambulate independently  

## 2019-04-19 ENCOUNTER — Telehealth: Payer: Self-pay | Admitting: Emergency Medicine

## 2019-04-19 NOTE — Telephone Encounter (Signed)
No voicemail set up, no answer when called.

## 2019-04-20 LAB — NOVEL CORONAVIRUS, NAA: SARS-CoV-2, NAA: NOT DETECTED

## 2020-02-16 ENCOUNTER — Other Ambulatory Visit: Payer: Self-pay

## 2020-02-16 ENCOUNTER — Ambulatory Visit
Admission: EM | Admit: 2020-02-16 | Discharge: 2020-02-16 | Disposition: A | Discharge status: Home / Self Care | Payer: Commercial Managed Care - PPO | Attending: Emergency Medicine | Admitting: Emergency Medicine

## 2020-02-16 DIAGNOSIS — J029 Acute pharyngitis, unspecified: Secondary | ICD-10-CM

## 2020-02-16 LAB — POCT RAPID STREP A (OFFICE): Rapid Strep A Screen: NEGATIVE

## 2020-02-16 MED ORDER — LIDOCAINE VISCOUS HCL 2 % MT SOLN: 15.0000 mL | OROMUCOSAL | 0 refills | Status: DC | PRN

## 2020-02-16 MED ORDER — FLUTICASONE PROPIONATE 50 MCG/ACT NA SUSP
1.0000 | Freq: Every day | NASAL | 0 refills | Status: DC
Start: 2020-02-16 — End: 2020-04-21

## 2020-02-16 MED ORDER — CETIRIZINE HCL 10 MG PO TABS: 10.0000 mg | ORAL_TABLET | Freq: Every day | ORAL | 0 refills | Status: DC

## 2020-02-16 MED ORDER — ACETAMINOPHEN 500 MG PO TABS: 1000.0000 mg | ORAL_TABLET | Freq: Three times a day (TID) | ORAL | 0 refills | Status: AC | PRN

## 2020-02-16 NOTE — ED Provider Notes (Signed)
Jack Yates    CSN: [id number] Arrival date & time: 02/16/20  1214      History   Chief Complaint Chief Complaint  Patient presents with  . Sore Throat    HPI Jack Yates is a 24 y.o. male with history of MDD, cannabis use presenting for 3-day course of sore throat.  Patient resting history of strep, states it feels the same.  Has had chills, though no fever, cough, difficulty breathing or swelling, dental pain, ear pain, chest pain, palpitations, difficulty breathing.  Not take anything for symptoms.   History reviewed. No pertinent past medical history.  Patient Active Problem List   Diagnosis Date Noted  . Wrist fracture 12/14/2015  . MDD (major depressive disorder), single episode, moderate ([facility]) 04/04/2014  . Generalized anxiety disorder 04/04/2014  . Cannabis use disorder, mild, abuse 04/04/2014    History reviewed. No pertinent surgical history.     Home Medications    Prior to Admission medications   Medication Sig Start Date End Date Taking? Authorizing Provider  acetaminophen (TYLENOL) 500 MG tablet Take 2 tablets (1,000 mg total) by mouth every 8 (eight) hours as needed. 02/16/20   Hall-Potvin, [place], PA-C  cetirizine (ZYRTEC ALLERGY) 10 MG tablet Take 1 tablet (10 mg total) by mouth daily. 02/16/20   Hall-Potvin, [place], PA-C  fluticasone (FLONASE) 50 MCG/ACT nasal spray Place 1 spray into both nostrils daily. 02/16/20   Hall-Potvin, [place], PA-C  lidocaine (XYLOCAINE) 2 % solution Use as directed 15 mLs in the mouth or throat as needed for mouth pain. 02/16/20   Hall-Potvin, [place], PA-C    Family History History reviewed. No pertinent family history.  Social History Social History   Tobacco Use  . Smoking status: Never Smoker  . Smokeless tobacco: Never Used  Substance Use Topics  . Alcohol use: No  . Drug use: Yes    Types: Marijuana     Allergies   Patient has no known allergies.   Review of Systems As per HPI    Physical Exam Triage Vital Signs ED Triage Vitals  Enc Vitals Group     BP      Pulse      Resp      Temp      Temp src      SpO2      Weight      Height      Head Circumference      Peak Flow      Pain Score      Pain Loc      Pain Edu?      Excl. in GC?    No data found.  Updated Vital Signs BP 108/64   Pulse 88   Temp 98.9 F (37.2 C)   Resp 16   SpO2 97%   Visual Acuity Right Eye Distance:   Left Eye Distance:   Bilateral Distance:    Right Eye Near:   Left Eye Near:    Bilateral Near:     Physical Exam Constitutional:      General: He is not in acute distress.    Appearance: He is not toxic-appearing or diaphoretic.  HENT:     Head: Normocephalic and atraumatic.     Right Ear: Tympanic membrane and ear canal normal.     Left Ear: Tympanic membrane and ear canal normal.     Mouth/Throat:     Mouth: Mucous membranes are moist.     Pharynx: Oropharynx is clear.  No posterior oropharyngeal erythema or uvula swelling.     Tonsils: Tonsillar exudate present. 2+ on the right. 2+ on the left.     Comments: Mild exudate Eyes:     General: No scleral icterus.    Conjunctiva/sclera: Conjunctivae normal.     Pupils: Pupils are equal, round, and reactive to light.  Neck:     Comments: Trachea midline, negative JVD Cardiovascular:     Rate and Rhythm: Normal rate and regular rhythm.  Pulmonary:     Effort: Pulmonary effort is normal. No respiratory distress.     Breath sounds: No wheezing.  Musculoskeletal:     Cervical back: Neck supple. No tenderness.  Lymphadenopathy:     Cervical: No cervical adenopathy.  Skin:    Capillary Refill: Capillary refill takes less than 2 seconds.     Coloration: Skin is not jaundiced or pale.     Findings: No rash.  Neurological:     Mental Status: He is alert and oriented to person, place, and time.      UC Treatments / Results  Labs (all labs ordered are listed, but only abnormal results are displayed) Labs  Reviewed  POCT RAPID STREP A (OFFICE) - Normal  CULTURE, GROUP A STREP [facility]    EKG   Radiology No results found.  Procedures Procedures (including critical Yates time)  Medications Ordered in UC Medications - No data to display  Initial Impression / Assessment and Plan / UC Course  I have reviewed the triage vital signs and the nursing notes.  Pertinent labs & imaging results that were available during my Yates of the patient were reviewed by me and considered in my medical decision making (see chart for details).     Patient febrile, nontoxic in office today.  Covid testing deferred due to duration of symptoms.  Rapid strep negative, culture pending.  Reviewed supportive Yates as outlined below.  Return precautions discussed, pt verbalized understanding and is agreeable to plan. Final Clinical Impressions(s) / UC Diagnoses   Final diagnoses:  Sore throat     Discharge Instructions     Your rapid strep test was negative today.  The culture is pending.  Please look on your MyChart for test results.   We will notify you if the culture positive and outline a treatment plan at that time.   Please continue Tylenol and/or Ibuprofen as needed for fever, pain.  May try warm salt water gargles, cepacol lozenges, throat spray, warm tea or water with lemon/honey, or OTC cold relief medicine for throat discomfort.  May gargle, spit viscous lidocaine as prescribed for additional relief.  (Please note this may cause the back of your tongue and mouth to be numb as well)  For congestion: take a daily anti-histamine like Zyrtec, Claritin, and a oral decongestant to help with post nasal drip that may be irritating your throat.   It is important to stay hydrated: drink plenty of fluids (primarily water) to keep your throat moisturized and help further relieve irritation/discomfort.     ED Prescriptions    Medication Sig Dispense Auth. Provider   lidocaine (XYLOCAINE) 2 % solution Use as  directed 15 mLs in the mouth or throat as needed for mouth pain. 100 mL Hall-Potvin, [place], PA-C   cetirizine (ZYRTEC ALLERGY) 10 MG tablet Take 1 tablet (10 mg total) by mouth daily. 30 tablet Hall-Potvin, [place], PA-C   fluticasone (FLONASE) 50 MCG/ACT nasal spray Place 1 spray into both nostrils daily. 16 g Hall-Potvin, [place],  PA-C   acetaminophen (TYLENOL) 500 MG tablet Take 2 tablets (1,000 mg total) by mouth every 8 (eight) hours as needed. 30 tablet Hall-Potvin, [place], PA-C     PDMP not reviewed this encounter.   Hall-Potvin, [place], [place] 02/16/20 1430

## 2020-02-16 NOTE — ED Triage Notes (Signed)
Sore throat x 3 weeks, hx of strep, states it feels the same. No fever at home but has had chills

## 2020-02-16 NOTE — Discharge Instructions (Addendum)
Your rapid strep test was negative today.  The culture is pending.  Please look on your MyChart for test results.   We will notify you if the culture positive and outline a treatment plan at that time.   Please continue Tylenol and/or Ibuprofen as needed for fever, pain.  May try warm salt water gargles, cepacol lozenges, throat spray, warm tea or water with lemon/honey, or OTC cold relief medicine for throat discomfort.  May gargle, spit viscous lidocaine as prescribed for additional relief.  (Please note this may cause the back of your tongue and mouth to be numb as well)  For congestion: take a daily anti-histamine like Zyrtec, Claritin, and a oral decongestant to help with post nasal drip that may be irritating your throat.   It is important to stay hydrated: drink plenty of fluids (primarily water) to keep your throat moisturized and help further relieve irritation/discomfort.  

## 2020-02-18 LAB — CULTURE, GROUP A STREP (THRC)

## 2020-02-19 ENCOUNTER — Encounter (HOSPITAL_COMMUNITY): Payer: Self-pay

## 2020-02-19 ENCOUNTER — Ambulatory Visit (HOSPITAL_COMMUNITY)
Admission: EM | Admit: 2020-02-19 | Discharge: 2020-02-19 | Disposition: A | Discharge status: Home / Self Care | Payer: Commercial Managed Care - PPO | Attending: Emergency Medicine | Admitting: Emergency Medicine

## 2020-02-19 ENCOUNTER — Other Ambulatory Visit: Payer: Self-pay

## 2020-02-19 DIAGNOSIS — J029 Acute pharyngitis, unspecified: Secondary | ICD-10-CM

## 2020-02-19 DIAGNOSIS — Z20822 Contact with and (suspected) exposure to covid-19: Secondary | ICD-10-CM | POA: Diagnosis not present

## 2020-02-19 DIAGNOSIS — J020 Streptococcal pharyngitis: Secondary | ICD-10-CM | POA: Diagnosis not present

## 2020-02-19 DIAGNOSIS — R519 Headache, unspecified: Secondary | ICD-10-CM | POA: Insufficient documentation

## 2020-02-19 DIAGNOSIS — Z79899 Other long term (current) drug therapy: Secondary | ICD-10-CM | POA: Diagnosis not present

## 2020-02-19 DIAGNOSIS — H9201 Otalgia, right ear: Secondary | ICD-10-CM | POA: Insufficient documentation

## 2020-02-19 LAB — POCT RAPID STREP A: Streptococcus, Group A Screen (Direct): POSITIVE — AB

## 2020-02-19 LAB — SARS CORONAVIRUS 2 (TAT 6-24 HRS): SARS Coronavirus 2: NEGATIVE

## 2020-02-19 MED ORDER — IBUPROFEN 800 MG PO TABS: 800.0000 mg | ORAL_TABLET | Freq: Three times a day (TID) | ORAL | 0 refills | Status: AC

## 2020-02-19 MED ORDER — AMOXICILLIN 500 MG PO CAPS: 500.0000 mg | ORAL_CAPSULE | Freq: Two times a day (BID) | ORAL | 0 refills | Status: AC

## 2020-02-19 NOTE — Discharge Instructions (Signed)

## 2020-02-19 NOTE — ED Triage Notes (Signed)
Pt c/o sore throat x3 weeks, increasing in severity the past several days. Reports dysphagia, right ear pain, HA, and chills.  Pt reports h/o strep throat.  Denies abdominal pain, n/v/d, congestion, runny nose. Throat erythemic, tonsils edematous.

## 2020-02-19 NOTE — ED Provider Notes (Signed)
[facility]    CSN: [id number] Arrival date & time: 02/19/20  1517      History   Chief Complaint Chief Complaint  Patient presents with  . Sore Throat    HPI Jack Yates is a 24 y.o. male presenting today for evaluation of sore throat.  Patient reports that approximately 3 weeks ago he began to develop a small tickle in his throat, but over the past week symptoms have progressed and worsened.  He has had a lot of dysphagia and pain with swallowing.  Feels as if his tonsils are swollen, probably on the right side.  Also associated right ear pain with swallowing.  Headache and chills.  Has history of strep throat.  Was seen 3 days ago with negative strep test treated symptomatically and supportively.  Denies any known fevers.  Denies GI symptoms.  HPI  History reviewed. No pertinent past medical history.  Patient Active Problem List   Diagnosis Date Noted  . Wrist fracture 12/14/2015  . MDD (major depressive disorder), single episode, moderate ([facility]) 04/04/2014  . Generalized anxiety disorder 04/04/2014  . Cannabis use disorder, mild, abuse 04/04/2014    History reviewed. No pertinent surgical history.     Home Medications    Prior to Admission medications   Medication Sig Start Date End Date Taking? Authorizing Provider  acetaminophen (TYLENOL) 500 MG tablet Take 2 tablets (1,000 mg total) by mouth every 8 (eight) hours as needed. 02/16/20  Yes Hall-Potvin, [place], PA-C  amoxicillin (AMOXIL) 500 MG capsule Take 1 capsule (500 mg total) by mouth 2 (two) times daily for 10 days. 02/19/20 02/29/20  Trude Cansler C, PA-C  cetirizine (ZYRTEC ALLERGY) 10 MG tablet Take 1 tablet (10 mg total) by mouth daily. 02/16/20   Hall-Potvin, [place], PA-C  fluticasone (FLONASE) 50 MCG/ACT nasal spray Place 1 spray into both nostrils daily. 02/16/20   Hall-Potvin, [place], PA-C  ibuprofen (ADVIL) 800 MG tablet Take 1 tablet (800 mg total) by mouth 3 (three) times daily.  02/19/20   Tiajuana Leppanen C, PA-C  lidocaine (XYLOCAINE) 2 % solution Use as directed 15 mLs in the mouth or throat as needed for mouth pain. 02/16/20   Hall-Potvin, [place], PA-C    Family History History reviewed. No pertinent family history.  Social History Social History   Tobacco Use  . Smoking status: Never Smoker  . Smokeless tobacco: Never Used  Substance Use Topics  . Alcohol use: No  . Drug use: Yes    Types: Marijuana     Allergies   Patient has no known allergies.   Review of Systems Review of Systems  Constitutional: Negative for activity change, appetite change, chills, fatigue and fever.  HENT: Positive for ear pain and sore throat. Negative for congestion, rhinorrhea, sinus pressure and trouble swallowing.   Eyes: Negative for discharge and redness.  Respiratory: Negative for cough, chest tightness and shortness of breath.   Cardiovascular: Negative for chest pain.  Gastrointestinal: Negative for abdominal pain, diarrhea, nausea and vomiting.  Musculoskeletal: Negative for myalgias.  Skin: Negative for rash.  Neurological: Negative for dizziness, light-headedness and headaches.     Physical Exam Triage Vital Signs ED Triage Vitals  Enc Vitals Group     BP      Pulse      Resp      Temp      Temp src      SpO2      Weight      Height  Head Circumference      Peak Flow      Pain Score      Pain Loc      Pain Edu?      Excl. in GC?    No data found.  Updated Vital Signs BP 130/71 (BP Location: Left Arm)   Pulse 80   Temp 98.8 F (37.1 C) (Oral)   Resp 18   SpO2 99%   Visual Acuity Right Eye Distance:   Left Eye Distance:   Bilateral Distance:    Right Eye Near:   Left Eye Near:    Bilateral Near:     Physical Exam Vitals and nursing note reviewed.  Constitutional:      Appearance: He is well-developed.     Comments: No acute distress  HENT:     Head: Normocephalic and atraumatic.     Ears:     Comments: Bilateral  ears without tenderness to palpation of external auricle, tragus and mastoid, EAC's without erythema or swelling, TM's with good bony landmarks and cone of light. Non erythematous.     Nose: Nose normal.     Mouth/Throat:     Comments: Bilateral tonsils appear moderately enlarged erythematous and mild to moderate exudate bilaterally Posterior pharynx patent, uvula midline Eyes:     Conjunctiva/sclera: Conjunctivae normal.  Cardiovascular:     Rate and Rhythm: Normal rate.  Pulmonary:     Effort: Pulmonary effort is normal. No respiratory distress.     Comments: Breathing comfortably at rest, CTABL, no wheezing, rales or other adventitious sounds auscultated Abdominal:     General: There is no distension.  Musculoskeletal:        General: Normal range of motion.     Cervical back: Neck supple.  Skin:    General: Skin is warm and dry.  Neurological:     Mental Status: He is alert and oriented to person, place, and time.      UC Treatments / Results  Labs (all labs ordered are listed, but only abnormal results are displayed) Labs Reviewed  POCT RAPID STREP A - Abnormal; Notable for the following components:      Result Value   Streptococcus, Group A Screen (Direct) POSITIVE (*)    All other components within normal limits  SARS CORONAVIRUS 2 (TAT 6-24 HRS)    EKG   Radiology No results found.  Procedures Procedures (including critical care time)  Medications Ordered in UC Medications - No data to display  Initial Impression / Assessment and Plan / UC Course  I have reviewed the triage vital signs and the nursing notes.  Pertinent labs & imaging results that were available during my care of the patient were reviewed by me and considered in my medical decision making (see chart for details).     Strep test positive, treating with amoxicillin twice daily x10 days.  Discussed symptomatic and supportive care.  Rest and fluids.  No sign of peritonsillar abscess at this  time.  Discussed strict return precautions. Patient verbalized understanding and is agreeable with plan.  Final Clinical Impressions(s) / UC Diagnoses   Final diagnoses:  Strep pharyngitis     Discharge Instructions     Sore Throat  Your rapid strep test was positive today. We will treat you for strep throat with an antibiotic. Please take Amoxicillin as prescribed.   Please continue Tylenol or Ibuprofen for fever and pain. May try salt water gargles, cepacol lozenges, throat spray, or OTC cold relief medicine  for throat discomfort. If you also have congestion take a daily anti-histamine like Zyrtec, Claritin, and a oral decongestant to help with post nasal drip that may be irritating your throat.   Stay hydrated and drink plenty of fluids to keep your throat coated relieve irritation.     ED Prescriptions    Medication Sig Dispense Auth. Provider   amoxicillin (AMOXIL) 500 MG capsule Take 1 capsule (500 mg total) by mouth 2 (two) times daily for 10 days. 20 capsule Onita Pfluger C, PA-C   ibuprofen (ADVIL) 800 MG tablet Take 1 tablet (800 mg total) by mouth 3 (three) times daily. 21 tablet Celese Banner, [place] C, PA-C     PDMP not reviewed this encounter.   Sharyon Cable [place] C, PA-C 02/19/20 0840

## 2020-04-21 ENCOUNTER — Ambulatory Visit
Admission: EM | Admit: 2020-04-21 | Discharge: 2020-04-21 | Disposition: A | Discharge status: Home / Self Care | Payer: Commercial Managed Care - PPO | Attending: Physician Assistant | Admitting: Physician Assistant

## 2020-04-21 ENCOUNTER — Other Ambulatory Visit: Payer: Self-pay

## 2020-04-21 DIAGNOSIS — R0981 Nasal congestion: Secondary | ICD-10-CM

## 2020-04-21 DIAGNOSIS — R059 Cough, unspecified: Secondary | ICD-10-CM

## 2020-04-21 DIAGNOSIS — R05 Cough: Secondary | ICD-10-CM | POA: Diagnosis not present

## 2020-04-21 DIAGNOSIS — J3489 Other specified disorders of nose and nasal sinuses: Secondary | ICD-10-CM

## 2020-04-21 MED ORDER — FLUTICASONE PROPIONATE 50 MCG/ACT NA SUSP
2.0000 | Freq: Every day | NASAL | 0 refills | Status: AC
Start: 2020-04-21 — End: ?

## 2020-04-21 MED ORDER — IPRATROPIUM BROMIDE 0.06 % NA SOLN: 2.0000 | Freq: Four times a day (QID) | NASAL | 0 refills | Status: AC

## 2020-04-21 MED ORDER — BENZONATATE 200 MG PO CAPS
200.0000 mg | ORAL_CAPSULE | Freq: Three times a day (TID) | ORAL | 0 refills | Status: DC
Start: 2020-04-21 — End: 2020-05-12

## 2020-04-21 NOTE — ED Triage Notes (Signed)
Pt c/o cough chest and nasal congestion with headache that has persisted for 5 days.  He has not been vaccinated for COVID.

## 2020-04-21 NOTE — Discharge Instructions (Signed)
COVID PCR testing ordered. I would like you to quarantine until testing results. Tessalon for cough. Start flonase, atrovent nasal spray for nasal congestion/drainage. You can use over the counter nasal saline rinse such as neti pot for nasal congestion. Keep hydrated, your urine should be clear to pale yellow in color. Tylenol/motrin for fever and pain. If experiencing shortness of breath, trouble breathing, go to the emergency department for further evaluation needed.    

## 2020-04-21 NOTE — ED Provider Notes (Signed)
[name]    CSN: [id number] Arrival date & time: 04/21/20  1004      History   Chief Complaint Chief Complaint  Patient presents with  . Cough  . Nasal Congestion  . Chills  . Headache    HPI Jack Yates is a 24 y.o. male.   24 year old male comes in for 5 day of URI symptoms. Cough, nasal congestion, chest congestion, headache, body aches. Denies fever. Denies abdominal pain, nausea, vomiting, diarrhea. Denies shortness of breath, loss of taste/smell. Does have pleuritic chest pain intermittently.     History reviewed. No pertinent past medical history.  Patient Active Problem List   Diagnosis Date Noted  . Wrist fracture 12/14/2015  . MDD (major depressive disorder), single episode, moderate (HCC) 04/04/2014  . Generalized anxiety disorder 04/04/2014  . Cannabis use disorder, mild, abuse 04/04/2014    History reviewed. No pertinent surgical history.     Home Medications    Prior to Admission medications   Medication Sig Start Date End Date Taking? Authorizing Provider  acetaminophen (TYLENOL) 500 MG tablet Take 2 tablets (1,000 mg total) by mouth every 8 (eight) hours as needed. 02/16/20   Hall-Potvin, [place], PA-C  benzonatate (TESSALON) 200 MG capsule Take 1 capsule (200 mg total) by mouth every 8 (eight) hours. 04/21/20   Cathie Hoops, Charde Macfarlane V, PA-C  fluticasone (FLONASE) 50 MCG/ACT nasal spray Place 2 sprays into both nostrils daily. 04/21/20   Cathie Hoops, Zakery Normington V, PA-C  ibuprofen (ADVIL) 800 MG tablet Take 1 tablet (800 mg total) by mouth 3 (three) times daily. 02/19/20   Wieters, Hallie C, PA-C  ipratropium (ATROVENT) 0.06 % nasal spray Place 2 sprays into both nostrils 4 (four) times daily. 04/21/20   Cathie Hoops, Jnai Snellgrove V, PA-C  cetirizine (ZYRTEC ALLERGY) 10 MG tablet Take 1 tablet (10 mg total) by mouth daily. 02/16/20 04/21/20  Hall-Potvin, [place], PA-C    Family History Family History  Problem Relation Age of Onset  . Thyroid disease Father   . Cancer Father   .  Thyroid disease Mother   . Cancer Mother     Social History Social History   Tobacco Use  . Smoking status: Never Smoker  . Smokeless tobacco: Never Used  Substance Use Topics  . Alcohol use: No  . Drug use: Yes    Types: Marijuana     Allergies   Patient has no known allergies.   Review of Systems Review of Systems  Reason unable to perform ROS: See HPI as above.     Physical Exam Triage Vital Signs ED Triage Vitals  Enc Vitals Group     BP 04/21/20 1049 103/80     Pulse Rate 04/21/20 1049 70     Resp 04/21/20 1049 14     Temp 04/21/20 1049 97.8 F (36.6 C)     Temp Source 04/21/20 1049 Oral     SpO2 04/21/20 1049 98 %     Weight --      Height --      Head Circumference --      Peak Flow --      Pain Score 04/21/20 1120 5     Pain Loc --      Pain Edu? --      Excl. in GC? --    No data found.  Updated Vital Signs BP 103/80 (BP Location: Left Arm)   Pulse 70   Temp 97.8 F (36.6 C) (Oral)   Resp 14  SpO2 98%   Physical Exam Constitutional:      General: He is not in acute distress.    Appearance: He is well-developed. He is not ill-appearing, toxic-appearing or diaphoretic.  HENT:     Head: Normocephalic and atraumatic.     Right Ear: Ear canal and external ear normal. Tympanic membrane is erythematous. Tympanic membrane is not bulging.     Left Ear: Ear canal and external ear normal. Tympanic membrane is erythematous. Tympanic membrane is not bulging.     Nose: Congestion and rhinorrhea present.     Right Sinus: Maxillary sinus tenderness and frontal sinus tenderness present.     Left Sinus: Maxillary sinus tenderness and frontal sinus tenderness present.     Mouth/Throat:     Mouth: Mucous membranes are moist.     Pharynx: Oropharynx is clear. Uvula midline.  Eyes:     Conjunctiva/sclera: Conjunctivae normal.     Pupils: Pupils are equal, round, and reactive to light.  Cardiovascular:     Rate and Rhythm: Normal rate and regular rhythm.    Pulmonary:     Effort: Pulmonary effort is normal. No accessory muscle usage, prolonged expiration, respiratory distress or retractions.     Breath sounds: No decreased air movement or transmitted upper airway sounds. No decreased breath sounds.     Comments: LCTAB Chest:     Comments: Chest tenderness to palpation. Musculoskeletal:     Cervical back: Normal range of motion and neck supple.  Skin:    General: Skin is warm and dry.  Neurological:     Mental Status: He is alert and oriented to person, place, and time.      UC Treatments / Results  Labs (all labs ordered are listed, but only abnormal results are displayed) Labs Reviewed  NOVEL CORONAVIRUS, NAA    EKG   Radiology No results found.  Procedures Procedures (including critical care time)  Medications Ordered in UC Medications - No data to display  Initial Impression / Assessment and Plan / UC Course  I have reviewed the triage vital signs and the nursing notes.  Pertinent labs & imaging results that were available during my care of the patient were reviewed by me and considered in my medical decision making (see chart for details).    COVID PCR test ordered. Patient to quarantine until testing results return. No alarming signs on exam. LCTAB. Symptomatic treatment discussed.  Push fluids.  Return precautions given.  Patient expresses understanding and agrees to plan.  Final Clinical Impressions(s) / UC Diagnoses   Final diagnoses:  Cough  Nasal congestion  Sinus pressure    ED Prescriptions    Medication Sig Dispense Auth. Provider   fluticasone (FLONASE) 50 MCG/ACT nasal spray Place 2 sprays into both nostrils daily. 1 g Marae Cottrell V, PA-C   ipratropium (ATROVENT) 0.06 % nasal spray Place 2 sprays into both nostrils 4 (four) times daily. 15 mL Kaina Orengo V, PA-C   benzonatate (TESSALON) 200 MG capsule Take 1 capsule (200 mg total) by mouth every 8 (eight) hours. 21 capsule Belinda Fisher, PA-C     PDMP not  reviewed this encounter.   Belinda Fisher, PA-C 04/21/20 1138

## 2020-04-22 LAB — SARS-COV-2, NAA 2 DAY TAT

## 2020-04-22 LAB — NOVEL CORONAVIRUS, NAA: SARS-CoV-2, NAA: DETECTED — AB

## 2020-04-23 ENCOUNTER — Telehealth (HOSPITAL_COMMUNITY): Payer: Self-pay | Admitting: Nurse Practitioner

## 2020-04-23 NOTE — Telephone Encounter (Signed)
Called to Discuss with patient about Covid symptoms and the use of the monoclonal antibody infusion for those with mild to moderate Covid symptoms and at a high risk of hospitalization.     Pt appears to qualify for this infusion due to co-morbid conditions and/or a member of an at-risk group in accordance with the FDA Emergency Use Authorization.    Unable to reach pt- no answer and no voicemail. Mychart message sent.

## 2020-05-12 ENCOUNTER — Other Ambulatory Visit: Payer: Self-pay

## 2020-05-12 ENCOUNTER — Ambulatory Visit
Admission: EM | Admit: 2020-05-12 | Discharge: 2020-05-12 | Disposition: A | Discharge status: Home / Self Care | Payer: Commercial Managed Care - PPO | Attending: Emergency Medicine | Admitting: Emergency Medicine

## 2020-05-12 ENCOUNTER — Encounter: Payer: Self-pay | Admitting: *Deleted

## 2020-05-12 DIAGNOSIS — J029 Acute pharyngitis, unspecified: Secondary | ICD-10-CM

## 2020-05-12 DIAGNOSIS — R059 Cough, unspecified: Secondary | ICD-10-CM

## 2020-05-12 HISTORY — DX: COVID-19: U07.1

## 2020-05-12 LAB — POCT RAPID STREP A (OFFICE): Rapid Strep A Screen: NEGATIVE

## 2020-05-12 MED ORDER — LIDOCAINE VISCOUS HCL 2 % MT SOLN
15.0000 mL | OROMUCOSAL | 0 refills | Status: DC | PRN
Start: 2020-05-12 — End: 2020-07-22

## 2020-05-12 MED ORDER — BENZONATATE 200 MG PO CAPS
200.0000 mg | ORAL_CAPSULE | Freq: Three times a day (TID) | ORAL | 0 refills | Status: DC
Start: 2020-05-12 — End: 2020-07-22

## 2020-05-12 NOTE — Discharge Instructions (Addendum)
Your rapid strep test was negative today.  The culture is pending.  Please look on your MyChart for test results.   We will notify you if the culture positive and outline a treatment plan at that time.   Please continue Tylenol and/or Ibuprofen as needed for fever, pain.  May try warm salt water gargles, cepacol lozenges, throat spray, warm tea or water with lemon/honey, or OTC cold relief medicine for throat discomfort.  May gargle, spit viscous lidocaine as prescribed for additional relief.  (Please note this may cause the back of your tongue and mouth to be numb as well)  For congestion: take a daily anti-histamine like Zyrtec, Claritin, and a oral decongestant to help with post nasal drip that may be irritating your throat.   It is important to stay hydrated: drink plenty of fluids (primarily water) to keep your throat moisturized and help further relieve irritation/discomfort.  

## 2020-05-12 NOTE — ED Triage Notes (Addendum)
Reports starting with cough, chills, body aches, sore throat, HA yesterday.  Unknown if fevers.  Pt reports getting over Covid-19 approx 2 wks ago.

## 2020-05-12 NOTE — ED Provider Notes (Signed)
Jack Yates    CSN: [id number] Arrival date & time: 05/12/20  1152      History   Chief Complaint Chief Complaint  Patient presents with  . Cough  . Chills    HPI Jack Yates is a 24 y.o. male  Subjective:   History was provided by the patient. Jack [name] is a 24 y.o. male who presents for evaluation of a sore throat. Associated symptoms include dry cough, nasal blockage, post nasal drip, sinus and nasal congestion and sore throat. Onset of symptoms was a few days ago, unchanged since that time.  He is drinking plenty of fluids. He has not had recent close exposure to someone with proven streptococcal pharyngitis.  Was diagnosed with Covid greater than 2 weeks ago: Had complete resolution of symptoms.  No fever, chest pain, difficulty breathing. The following portions of the patient's history were reviewed and updated as appropriate: allergies, current medications, past family history, past medical history, past social history, past surgical history and problem list.       Past Medical History:  Diagnosis Date  . COVID-19     Patient Active Problem List   Diagnosis Date Noted  . Wrist fracture 12/14/2015  . MDD (major depressive disorder), single episode, moderate (HCC) 04/04/2014  . Generalized anxiety disorder 04/04/2014  . Cannabis use disorder, mild, abuse 04/04/2014    History reviewed. No pertinent surgical history.     Home Medications    Prior to Admission medications   Medication Sig Start Date End Date Taking? Authorizing Provider  acetaminophen (TYLENOL) 500 MG tablet Take 2 tablets (1,000 mg total) by mouth every 8 (eight) hours as needed. 02/16/20   Hall-Potvin, [place], PA-C  benzonatate (TESSALON) 200 MG capsule Take 1 capsule (200 mg total) by mouth every 8 (eight) hours. 05/12/20   Hall-Potvin, [place], PA-C  fluticasone (FLONASE) 50 MCG/ACT nasal spray Place 2 sprays into both nostrils daily. 04/21/20   Cathie Hoops, Amy V, PA-C    ibuprofen (ADVIL) 800 MG tablet Take 1 tablet (800 mg total) by mouth 3 (three) times daily. 02/19/20   Wieters, Hallie C, PA-C  ipratropium (ATROVENT) 0.06 % nasal spray Place 2 sprays into both nostrils 4 (four) times daily. 04/21/20   Cathie Hoops, Amy V, PA-C  lidocaine (XYLOCAINE) 2 % solution Use as directed 15 mLs in the mouth or throat as needed for mouth pain. 05/12/20   Hall-Potvin, [place], PA-C  cetirizine (ZYRTEC ALLERGY) 10 MG tablet Take 1 tablet (10 mg total) by mouth daily. 02/16/20 04/21/20  Hall-Potvin, [place], PA-C    Family History Family History  Problem Relation Age of Onset  . Thyroid disease Father   . Cancer Father   . Thyroid disease Mother   . Cancer Mother     Social History Social History   Tobacco Use  . Smoking status: Never Smoker  . Smokeless tobacco: Never Used  Vaping Use  . Vaping Use: Never used  Substance Use Topics  . Alcohol use: Yes    Comment: occasionally  . Drug use: Yes    Types: Marijuana     Allergies   Patient has no known allergies.   Review of Systems As per HPI   Physical Exam Triage Vital Signs ED Triage Vitals  Enc Vitals Group     BP      Pulse      Resp      Temp      Temp src      SpO2  Weight      Height      Head Circumference      Peak Flow      Pain Score      Pain Loc      Pain Edu?      Excl. in GC?    No data found.  Updated Vital Signs BP 118/75   Pulse 67   Temp 98.7 F (37.1 C) (Temporal)   Resp 16   SpO2 97%   Visual Acuity Right Eye Distance:   Left Eye Distance:   Bilateral Distance:    Right Eye Near:   Left Eye Near:    Bilateral Near:     Physical Exam Constitutional:      General: He is not in acute distress.    Appearance: He is not toxic-appearing or diaphoretic.  HENT:     Head: Normocephalic and atraumatic.     Right Ear: Tympanic membrane and ear canal normal.     Left Ear: Tympanic membrane and ear canal normal.     Mouth/Throat:     Mouth: Mucous membranes  are moist.     Pharynx: Oropharynx is clear. Posterior oropharyngeal erythema present. No oropharyngeal exudate.  Eyes:     General: No scleral icterus.    Conjunctiva/sclera: Conjunctivae normal.     Pupils: Pupils are equal, round, and reactive to light.  Neck:     Comments: Trachea midline, negative JVD Cardiovascular:     Rate and Rhythm: Normal rate and regular rhythm.  Pulmonary:     Effort: Pulmonary effort is normal. No respiratory distress.     Breath sounds: No wheezing.  Musculoskeletal:     Cervical back: Neck supple. Tenderness present.  Lymphadenopathy:     Cervical: Cervical adenopathy present.  Skin:    Capillary Refill: Capillary refill takes less than 2 seconds.     Coloration: Skin is not jaundiced or pale.     Findings: No rash.  Neurological:     Mental Status: He is alert and oriented to person, place, and time.      UC Treatments / Results  Labs (all labs ordered are listed, but only abnormal results are displayed) Labs Reviewed  POCT RAPID STREP A (OFFICE)    EKG   Radiology No results found.  Procedures Procedures (including critical Yates time)  Medications Ordered in UC Medications - No data to display  Initial Impression / Assessment and Plan / UC Course  I have reviewed the triage vital signs and the nursing notes.  Pertinent labs & imaging results that were available during my Yates of the patient were reviewed by me and considered in my medical decision making (see chart for details).     Rapid strep negative, culture pending.  Will treat supportively as outlined below.  Return precautions discussed, patient verbalized understanding and is agreeable to plan. Final Clinical Impressions(s) / UC Diagnoses   Final diagnoses:  Sore throat  Cough     Discharge Instructions     Your rapid strep test was negative today.  The culture is pending.  Please look on your MyChart for test results.   We will notify you if the culture  positive and outline a treatment plan at that time.   Please continue Tylenol and/or Ibuprofen as needed for fever, pain.  May try warm salt water gargles, cepacol lozenges, throat spray, warm tea or water with lemon/honey, or OTC cold relief medicine for throat discomfort.  May gargle, spit viscous lidocaine  as prescribed for additional relief.  (Please note this may cause the back of your tongue and mouth to be numb as well)  For congestion: take a daily anti-histamine like Zyrtec, Claritin, and a oral decongestant to help with post nasal drip that may be irritating your throat.   It is important to stay hydrated: drink plenty of fluids (primarily water) to keep your throat moisturized and help further relieve irritation/discomfort.     ED Prescriptions    Medication Sig Dispense Auth. Provider   benzonatate (TESSALON) 200 MG capsule Take 1 capsule (200 mg total) by mouth every 8 (eight) hours. 21 capsule Hall-Potvin, [place], PA-C   lidocaine (XYLOCAINE) 2 % solution Use as directed 15 mLs in the mouth or throat as needed for mouth pain. 100 mL Hall-Potvin, [place], PA-C     PDMP not reviewed this encounter.   Hall-Potvin, [place], [place] 05/12/20 1220

## 2020-05-14 LAB — CULTURE, GROUP A STREP (THRC)

## 2020-05-26 ENCOUNTER — Encounter: Payer: Self-pay | Admitting: Emergency Medicine

## 2020-05-26 ENCOUNTER — Other Ambulatory Visit: Payer: Self-pay

## 2020-05-26 ENCOUNTER — Ambulatory Visit
Admission: EM | Admit: 2020-05-26 | Discharge: 2020-05-26 | Disposition: A | Discharge status: Home / Self Care | Payer: Commercial Managed Care - PPO | Attending: Emergency Medicine | Admitting: Emergency Medicine

## 2020-05-26 DIAGNOSIS — K529 Noninfective gastroenteritis and colitis, unspecified: Secondary | ICD-10-CM | POA: Diagnosis not present

## 2020-05-26 MED ORDER — ONDANSETRON 4 MG PO TBDP
4.0000 mg | ORAL_TABLET | Freq: Three times a day (TID) | ORAL | 0 refills | Status: DC | PRN
Start: 2020-05-26 — End: 2020-07-22

## 2020-05-26 NOTE — ED Triage Notes (Signed)
Pt here for N/V starting last night and one episode of diarrhea this am

## 2020-05-26 NOTE — Discharge Instructions (Addendum)
Read handout about foods that can help with loose stools. Important to drink plenty of water and avoid sugary beverages. May take Zofran as needed for nausea.

## 2020-05-26 NOTE — ED Provider Notes (Signed)
[name]    CSN: [id number] Arrival date & time: 05/26/20  1035      History   Chief Complaint Chief Complaint  Patient presents with  . Emesis    HPI Jack Yates is a 24 y.o. male  Presenting for nausea and vomiting that began last night.  Denies projectile vomiting, biliary or bloody emesis.  No coffee-ground emesis.  Denies severe abdominal pain, though does endorse general abdominal discomfort.  Vomited "a few times "last night.  Did have loose stool this morning without blood or melena.  Denying rectal pain, urinary symptoms, fever.  Past Medical History:  Diagnosis Date  . COVID-19     Patient Active Problem List   Diagnosis Date Noted  . Wrist fracture 12/14/2015  . MDD (major depressive disorder), single episode, moderate (HCC) 04/04/2014  . Generalized anxiety disorder 04/04/2014  . Cannabis use disorder, mild, abuse 04/04/2014    History reviewed. No pertinent surgical history.     Home Medications    Prior to Admission medications   Medication Sig Start Date End Date Taking? Authorizing Provider  acetaminophen (TYLENOL) 500 MG tablet Take 2 tablets (1,000 mg total) by mouth every 8 (eight) hours as needed. 02/16/20   Hall-Potvin, [place], PA-C  benzonatate (TESSALON) 200 MG capsule Take 1 capsule (200 mg total) by mouth every 8 (eight) hours. Patient not taking: Reported on 05/26/2020 05/12/20   Hall-Potvin, [place], PA-C  fluticasone (FLONASE) 50 MCG/ACT nasal spray Place 2 sprays into both nostrils daily. 04/21/20   Cathie Hoops, Amy V, PA-C  ibuprofen (ADVIL) 800 MG tablet Take 1 tablet (800 mg total) by mouth 3 (three) times daily. 02/19/20   Wieters, Hallie C, PA-C  ipratropium (ATROVENT) 0.06 % nasal spray Place 2 sprays into both nostrils 4 (four) times daily. 04/21/20   Cathie Hoops, Amy V, PA-C  lidocaine (XYLOCAINE) 2 % solution Use as directed 15 mLs in the mouth or throat as needed for mouth pain. Patient not taking: Reported on 05/26/2020  05/12/20   Hall-Potvin, [place], PA-C  ondansetron (ZOFRAN ODT) 4 MG disintegrating tablet Take 1 tablet (4 mg total) by mouth every 8 (eight) hours as needed for nausea or vomiting. 05/26/20   Hall-Potvin, [place], PA-C  cetirizine (ZYRTEC ALLERGY) 10 MG tablet Take 1 tablet (10 mg total) by mouth daily. 02/16/20 04/21/20  Hall-Potvin, [place], PA-C    Family History Family History  Problem Relation Age of Onset  . Thyroid disease Father   . Cancer Father   . Thyroid disease Mother   . Cancer Mother     Social History Social History   Tobacco Use  . Smoking status: Never Smoker  . Smokeless tobacco: Never Used  Vaping Use  . Vaping Use: Never used  Substance Use Topics  . Alcohol use: Yes    Comment: occasionally  . Drug use: Yes    Types: Marijuana     Allergies   Patient has no known allergies.   Review of Systems Review of Systems  Constitutional: Negative for chills and fever.  Respiratory: Negative for cough.   Cardiovascular: Negative for chest pain.  Gastrointestinal: Positive for abdominal pain, diarrhea, nausea and vomiting. Negative for blood in stool, constipation, heartburn and melena.  Genitourinary: Negative for dysuria.  All other systems reviewed and are negative.    Physical Exam Triage Vital Signs ED Triage Vitals  Enc Vitals Group     BP      Pulse      Resp  Temp      Temp src      SpO2      Weight      Height      Head Circumference      Peak Flow      Pain Score      Pain Loc      Pain Edu?      Excl. in GC?    No data found.  Updated Vital Signs BP 118/72 (BP Location: Left Arm)   Pulse 71   Temp 97.9 F (36.6 C) (Oral)   Resp 20   SpO2 96%   Visual Acuity Right Eye Distance:   Left Eye Distance:   Bilateral Distance:    Right Eye Near:   Left Eye Near:    Bilateral Near:     Physical Exam Constitutional:      General: He is not in acute distress. HENT:     Head: Normocephalic and atraumatic.  Eyes:       General: No scleral icterus.    Pupils: Pupils are equal, round, and reactive to light.  Cardiovascular:     Rate and Rhythm: Normal rate.  Pulmonary:     Effort: Pulmonary effort is normal. No respiratory distress.     Breath sounds: No wheezing.  Abdominal:     General: Abdomen is flat. Bowel sounds are normal.     Palpations: Abdomen is soft. There is no hepatomegaly or splenomegaly.     Tenderness: There is no abdominal tenderness. There is no guarding or rebound. Negative signs include Murphy's sign, Rovsing's sign and McBurney's sign.  Skin:    Coloration: Skin is not jaundiced or pale.  Neurological:     Mental Status: He is alert and oriented to person, place, and time.      UC Treatments / Results  Labs (all labs ordered are listed, but only abnormal results are displayed) Labs Reviewed - No data to display  EKG   Radiology No results found.  Procedures Procedures (including critical care time)  Medications Ordered in UC Medications - No data to display  Initial Impression / Assessment and Plan / UC Course  I have reviewed the triage vital signs and the nursing notes.  Pertinent labs & imaging results that were available during my care of the patient were reviewed by me and considered in my medical decision making (see chart for details).     Afebrile, nontoxic, and appears to be well-hydrated.  Abdominal exam reassuring at this time.  Likely GI bug.  We will treat supportively as outlined below.  Return precautions discussed, patient verbalized understanding and is agreeable to plan. Final Clinical Impressions(s) / UC Diagnoses   Final diagnoses:  Gastroenteritis     Discharge Instructions     Read handout about foods that can help with loose stools. Important to drink plenty of water and avoid sugary beverages. May take Zofran as needed for nausea.    ED Prescriptions    Medication Sig Dispense Auth. Provider   ondansetron (ZOFRAN ODT) 4 MG  disintegrating tablet Take 1 tablet (4 mg total) by mouth every 8 (eight) hours as needed for nausea or vomiting. 21 tablet Hall-Potvin, [place], PA-C     PDMP not reviewed this encounter.   Hall-Potvin, [place], [place] 05/26/20 1101

## 2020-07-22 ENCOUNTER — Other Ambulatory Visit: Payer: Self-pay

## 2020-07-22 ENCOUNTER — Ambulatory Visit (HOSPITAL_COMMUNITY)
Admission: EM | Admit: 2020-07-22 | Discharge: 2020-07-22 | Disposition: A | Discharge status: Home / Self Care | Payer: Commercial Managed Care - PPO | Attending: Family Medicine | Admitting: Family Medicine

## 2020-07-22 ENCOUNTER — Encounter (HOSPITAL_COMMUNITY): Payer: Self-pay

## 2020-07-22 DIAGNOSIS — K529 Noninfective gastroenteritis and colitis, unspecified: Secondary | ICD-10-CM | POA: Diagnosis not present

## 2020-07-22 DIAGNOSIS — Z20822 Contact with and (suspected) exposure to covid-19: Secondary | ICD-10-CM | POA: Diagnosis not present

## 2020-07-22 LAB — RESP PANEL BY RT-PCR (FLU A&B, COVID) ARPGX2
Influenza A by PCR: NEGATIVE
Influenza B by PCR: NEGATIVE
SARS Coronavirus 2 by RT PCR: NEGATIVE

## 2020-07-22 MED ORDER — ONDANSETRON 4 MG PO TBDP
ORAL_TABLET | ORAL | Status: AC
  Filled 2020-07-22: qty 1

## 2020-07-22 MED ORDER — ONDANSETRON 4 MG PO TBDP
4.0000 mg | ORAL_TABLET | Freq: Once | ORAL | Status: AC
  Administered 2020-07-22: 4 mg via ORAL

## 2020-07-22 MED ORDER — ONDANSETRON 4 MG PO TBDP
4.0000 mg | ORAL_TABLET | Freq: Three times a day (TID) | ORAL | 0 refills | Status: DC | PRN
Start: 2020-07-22 — End: 2021-09-15

## 2020-07-22 NOTE — Discharge Instructions (Addendum)
Zofran as needed  Stay hydrated with electrolytes and water.  Follow up as needed for continued or worsening symptoms

## 2020-07-22 NOTE — ED Triage Notes (Signed)
Pt c/o emesis, stomach pain, chills and diarrhea X 3 days. Pt denies chest pain and SOB. Pt states he is unable to keep food down.

## 2020-07-22 NOTE — ED Provider Notes (Signed)
[facility]    CSN: [id number] Arrival date & time: 07/22/20  4782      History   Chief Complaint Chief Complaint  Patient presents with  . Chills  . Abdominal Pain  . Emesis  . Diarrhea    HPI Jack Yates is a 24 y.o. male.   Patient is a 24 year old male who presents today with generalized abdominal discomfort, chills, diarrhea for 3 days. Symptoms been constant, waxing waning. His girlfriend has also had similar symptoms. Mild cough. No fevers, chest congestion or shortness of breath. Has been drinking water but unable to hold food down.     Past Medical History:  Diagnosis Date  . COVID-19     Patient Active Problem List   Diagnosis Date Noted  . Wrist fracture 12/14/2015  . MDD (major depressive disorder), single episode, moderate (HCC) 04/04/2014  . Generalized anxiety disorder 04/04/2014  . Cannabis use disorder, mild, abuse 04/04/2014    History reviewed. No pertinent surgical history.     Home Medications    Prior to Admission medications   Medication Sig Start Date End Date Taking? Authorizing Provider  acetaminophen (TYLENOL) 500 MG tablet Take 2 tablets (1,000 mg total) by mouth every 8 (eight) hours as needed. 02/16/20   Hall-Potvin, [place], PA-C  fluticasone (FLONASE) 50 MCG/ACT nasal spray Place 2 sprays into both nostrils daily. 04/21/20   Cathie Hoops, Amy V, PA-C  ibuprofen (ADVIL) 800 MG tablet Take 1 tablet (800 mg total) by mouth 3 (three) times daily. 02/19/20   Wieters, Hallie C, PA-C  ipratropium (ATROVENT) 0.06 % nasal spray Place 2 sprays into both nostrils 4 (four) times daily. 04/21/20   Cathie Hoops, Amy V, PA-C  ondansetron (ZOFRAN ODT) 4 MG disintegrating tablet Take 1 tablet (4 mg total) by mouth every 8 (eight) hours as needed for nausea or vomiting. 07/22/20   Dahlia Byes A, NP  cetirizine (ZYRTEC ALLERGY) 10 MG tablet Take 1 tablet (10 mg total) by mouth daily. 02/16/20 04/21/20  Hall-Potvin, [place], PA-C    Family History Family  History  Problem Relation Age of Onset  . Thyroid disease Father   . Cancer Father   . Thyroid disease Mother   . Cancer Mother     Social History Social History   Tobacco Use  . Smoking status: Never Smoker  . Smokeless tobacco: Never Used  Vaping Use  . Vaping Use: Never used  Substance Use Topics  . Alcohol use: Yes    Comment: occasionally  . Drug use: Yes    Types: Marijuana     Allergies   Patient has no known allergies.   Review of Systems Review of Systems   Physical Exam Triage Vital Signs ED Triage Vitals  Enc Vitals Group     BP 07/22/20 0850 124/76     Pulse Rate 07/22/20 0850 67     Resp 07/22/20 0850 18     Temp 07/22/20 0850 (!) 97.5 F (36.4 C)     Temp Source 07/22/20 0850 Oral     SpO2 07/22/20 0850 99 %     Weight --      Height --      Head Circumference --      Peak Flow --      Pain Score 07/22/20 0852 7     Pain Loc --      Pain Edu? --      Excl. in GC? --    No data found.  Updated Vital  Signs BP 124/76 (BP Location: Right Arm)   Pulse 67   Temp (!) 97.5 F (36.4 C) (Oral)   Resp 18   SpO2 99%   Visual Acuity Right Eye Distance:   Left Eye Distance:   Bilateral Distance:    Right Eye Near:   Left Eye Near:    Bilateral Near:     Physical Exam Vitals and nursing note reviewed.  Constitutional:      General: He is not in acute distress.    Appearance: Normal appearance. He is not ill-appearing, toxic-appearing or diaphoretic.  HENT:     Head: Normocephalic and atraumatic.     Nose: Nose normal.  Eyes:     Conjunctiva/sclera: Conjunctivae normal.  Pulmonary:     Effort: Pulmonary effort is normal.  Musculoskeletal:        General: Normal range of motion.     Cervical back: Normal range of motion.  Skin:    General: Skin is warm and dry.  Neurological:     Mental Status: He is alert.  Psychiatric:        Mood and Affect: Mood normal.      UC Treatments / Results  Labs (all labs ordered are listed,  but only abnormal results are displayed) Labs Reviewed  RESP PANEL BY RT-PCR (FLU A&B, COVID) [id number]    EKG   Radiology No results found.  Procedures Procedures (including critical care time)  Medications Ordered in UC Medications  ondansetron (ZOFRAN-ODT) disintegrating tablet 4 mg (4 mg Oral Given 07/22/20 2505)    Initial Impression / Assessment and Plan / UC Course  I have reviewed the triage vital signs and the nursing notes.  Pertinent labs & imaging results that were available during my care of the patient were reviewed by me and considered in my medical decision making (see chart for details).     Gastroenteritis Zofran as needed. Hydration with electrolytes and water. Follow up as needed for continued or worsening symptoms  Final Clinical Impressions(s) / UC Diagnoses   Final diagnoses:  Gastroenteritis     Discharge Instructions     Zofran as needed  Stay hydrated with electrolytes and water.  Follow up as needed for continued or worsening symptoms     ED Prescriptions    Medication Sig Dispense Auth. Provider   ondansetron (ZOFRAN ODT) 4 MG disintegrating tablet Take 1 tablet (4 mg total) by mouth every 8 (eight) hours as needed for nausea or vomiting. 21 tablet Lamesha Tibbits A, NP     PDMP not reviewed this encounter.   Janace Aris, NP 07/22/20 [phone]

## 2021-09-15 ENCOUNTER — Ambulatory Visit
Admission: EM | Admit: 2021-09-15 | Discharge: 2021-09-15 | Disposition: A | Discharge status: Home / Self Care | Payer: Commercial Managed Care - PPO | Attending: Physician Assistant | Admitting: Physician Assistant

## 2021-09-15 ENCOUNTER — Other Ambulatory Visit: Payer: Self-pay

## 2021-09-15 ENCOUNTER — Encounter: Payer: Self-pay | Admitting: Emergency Medicine

## 2021-09-15 DIAGNOSIS — K529 Noninfective gastroenteritis and colitis, unspecified: Secondary | ICD-10-CM

## 2021-09-15 MED ORDER — ONDANSETRON 4 MG PO TBDP: 4.0000 mg | ORAL_TABLET | Freq: Three times a day (TID) | ORAL | 0 refills | Status: DC | PRN

## 2021-09-15 MED ORDER — ONDANSETRON HCL 4 MG/2ML IJ SOLN
4.0000 mg | Freq: Once | INTRAMUSCULAR | Status: AC
  Administered 2021-09-15: 4 mg via INTRAMUSCULAR

## 2021-09-15 NOTE — ED Provider Notes (Signed)
[name]    CSN: [id number] Arrival date & time: 09/15/21  0900      History   Chief Complaint Chief Complaint  Patient presents with   Emesis    HPI Jack Yates is a 26 y.o. male.   Patient here today for evaluation of nausea, vomiting that started yesterday.  He states that he has had this occur several times over the last couple months.  He has some lower abdominal discomfort but states this is likely from vomiting.  He has not had any fever.  He denies any blood in his stool or vomit.  The history is provided by the patient.   Past Medical History:  Diagnosis Date   COVID-19     Patient Active Problem List   Diagnosis Date Noted   Wrist fracture 12/14/2015   MDD (major depressive disorder), single episode, moderate (HCC) 04/04/2014   Generalized anxiety disorder 04/04/2014   Cannabis use disorder, mild, abuse 04/04/2014    History reviewed. No pertinent surgical history.     Home Medications    Prior to Admission medications   Medication Sig Start Date End Date Taking? Authorizing Provider  ondansetron (ZOFRAN-ODT) 4 MG disintegrating tablet Take 1 tablet (4 mg total) by mouth every 8 (eight) hours as needed. 09/15/21  Yes Tomi Bamberger, PA-C  acetaminophen (TYLENOL) 500 MG tablet Take 2 tablets (1,000 mg total) by mouth every 8 (eight) hours as needed. 02/16/20   Hall-Potvin, [place], PA-C  fluticasone (FLONASE) 50 MCG/ACT nasal spray Place 2 sprays into both nostrils daily. 04/21/20   Cathie Hoops, Amy V, PA-C  ibuprofen (ADVIL) 800 MG tablet Take 1 tablet (800 mg total) by mouth 3 (three) times daily. 02/19/20   Wieters, Hallie C, PA-C  ipratropium (ATROVENT) 0.06 % nasal spray Place 2 sprays into both nostrils 4 (four) times daily. 04/21/20   Cathie Hoops, Amy V, PA-C  cetirizine (ZYRTEC ALLERGY) 10 MG tablet Take 1 tablet (10 mg total) by mouth daily. 02/16/20 04/21/20  Hall-Potvin, [place], PA-C    Family History Family History  Problem Relation Age of  Onset   Thyroid disease Father    Cancer Father    Thyroid disease Mother    Cancer Mother     Social History Social History   Tobacco Use   Smoking status: Never   Smokeless tobacco: Never  Vaping Use   Vaping Use: Never used  Substance Use Topics   Alcohol use: Yes    Comment: occasionally   Drug use: Yes    Types: Marijuana     Allergies   Patient has no known allergies.   Review of Systems Review of Systems  Constitutional:  Negative for chills and fever.  Eyes:  Negative for discharge and redness.  Gastrointestinal:  Positive for abdominal pain, nausea and vomiting. Negative for diarrhea.    Physical Exam Triage Vital Signs ED Triage Vitals  Enc Vitals Group     BP      Pulse      Resp      Temp      Temp src      SpO2      Weight      Height      Head Circumference      Peak Flow      Pain Score      Pain Loc      Pain Edu?      Excl. in GC?    No data found.  Updated  Vital Signs BP 132/74 (BP Location: Right Arm)    Pulse 65    Temp 97.8 F (36.6 C) (Oral)    Resp 16    SpO2 98%     Physical Exam Vitals and nursing note reviewed.  Constitutional:      General: He is not in acute distress.    Appearance: Normal appearance. He is not ill-appearing.  HENT:     Head: Normocephalic and atraumatic.  Eyes:     Conjunctiva/sclera: Conjunctivae normal.  Cardiovascular:     Rate and Rhythm: Normal rate and regular rhythm.     Heart sounds: Normal heart sounds. No murmur heard. Pulmonary:     Effort: Pulmonary effort is normal. No respiratory distress.     Breath sounds: Normal breath sounds. No wheezing, rhonchi or rales.  Abdominal:     General: Abdomen is flat. Bowel sounds are normal. There is no distension.     Palpations: Abdomen is soft.     Tenderness: There is abdominal tenderness (mild diffuse TTP). There is no guarding or rebound.  Skin:    General: Skin is warm and dry.  Neurological:     Mental Status: He is alert.   Psychiatric:        Mood and Affect: Mood normal.        Behavior: Behavior normal.     UC Treatments / Results  Labs (all labs ordered are listed, but only abnormal results are displayed) Labs Reviewed - No data to display  EKG   Radiology No results found.  Procedures Procedures (including critical care time)  Medications Ordered in UC Medications  ondansetron (ZOFRAN) injection 4 mg (4 mg Intramuscular Given 09/15/21 0932)    Initial Impression / Assessment and Plan / UC Course  I have reviewed the triage vital signs and the nursing notes.  Pertinent labs & imaging results that were available during my care of the patient were reviewed by me and considered in my medical decision making (see chart for details).    Suspect likely acute gastroenteritis given abrupt onset, Zofran administered in office with some relief.  Prescription of Zofran sent to pharmacy and recommended increase fluids.  Recommend follow-up with primary care.  Final Clinical Impressions(s) / UC Diagnoses   Final diagnoses:  Gastroenteritis   Discharge Instructions   None    ED Prescriptions     Medication Sig Dispense Auth. Provider   ondansetron (ZOFRAN-ODT) 4 MG disintegrating tablet Take 1 tablet (4 mg total) by mouth every 8 (eight) hours as needed. 20 tablet Tomi Bamberger, PA-C      PDMP not reviewed this encounter.   Tomi Bamberger, PA-C 09/15/21 1300

## 2021-09-15 NOTE — ED Triage Notes (Signed)
Over the last couple months he has randomly woken up and having to vomit, happening once every couple of weeks. Also reports bilateral lower abdominal pain with this that he believes is associated with retching. Denies fevers.

## 2021-09-18 ENCOUNTER — Telehealth: Payer: Self-pay | Admitting: Emergency Medicine

## 2021-09-18 MED ORDER — ONDANSETRON 4 MG PO TBDP: 4.0000 mg | ORAL_TABLET | Freq: Three times a day (TID) | ORAL | 0 refills | Status: AC | PRN

## 2022-05-11 ENCOUNTER — Ambulatory Visit: Payer: Self-pay | Admitting: Family Medicine

## 2022-09-02 ENCOUNTER — Ambulatory Visit: Payer: BLUE CROSS/BLUE SHIELD | Admitting: Allergy
